# Patient Record
Sex: Female | Born: 1950 | Race: White | Hispanic: No | Marital: Married | Smoking: Current every day smoker
Health system: Southern US, Community
[De-identification: ages and names within clinical notes are randomized; demographics above are authoritative.]

## PROBLEM LIST (undated history)

## (undated) DIAGNOSIS — K219 Gastro-esophageal reflux disease without esophagitis: Secondary | ICD-10-CM

## (undated) DIAGNOSIS — F419 Anxiety disorder, unspecified: Secondary | ICD-10-CM

## (undated) DIAGNOSIS — F32A Depression, unspecified: Secondary | ICD-10-CM

## (undated) DIAGNOSIS — F329 Major depressive disorder, single episode, unspecified: Secondary | ICD-10-CM

## (undated) HISTORY — DX: Depression, unspecified: F32.A

## (undated) HISTORY — DX: Anxiety disorder, unspecified: F41.9

## (undated) HISTORY — DX: Gastro-esophageal reflux disease without esophagitis: K21.9

## (undated) HISTORY — DX: Major depressive disorder, single episode, unspecified: F32.9

---

## 2003-11-21 ENCOUNTER — Other Ambulatory Visit: Payer: Self-pay

## 2003-11-22 ENCOUNTER — Inpatient Hospital Stay: Payer: Self-pay | Admitting: Internal Medicine

## 2003-11-27 ENCOUNTER — Ambulatory Visit: Payer: Self-pay | Admitting: Family Medicine

## 2003-12-12 ENCOUNTER — Ambulatory Visit: Payer: Self-pay | Admitting: Family Medicine

## 2005-11-12 ENCOUNTER — Ambulatory Visit: Payer: Self-pay | Admitting: Internal Medicine

## 2005-11-12 ENCOUNTER — Other Ambulatory Visit: Admission: RE | Admit: 2005-11-12 | Discharge: 2005-11-12 | Payer: Self-pay | Admitting: Internal Medicine

## 2006-10-28 ENCOUNTER — Telehealth: Payer: Self-pay | Admitting: Family Medicine

## 2006-11-08 ENCOUNTER — Ambulatory Visit: Payer: Self-pay | Admitting: Family Medicine

## 2006-11-08 DIAGNOSIS — F419 Anxiety disorder, unspecified: Secondary | ICD-10-CM

## 2007-09-22 ENCOUNTER — Emergency Department (HOSPITAL_COMMUNITY): Admission: EM | Admit: 2007-09-22 | Discharge: 2007-09-22 | Payer: Self-pay | Admitting: Emergency Medicine

## 2008-04-29 ENCOUNTER — Emergency Department: Payer: Self-pay | Admitting: Unknown Physician Specialty

## 2008-04-30 ENCOUNTER — Ambulatory Visit: Payer: Self-pay | Admitting: Family Medicine

## 2008-05-07 ENCOUNTER — Ambulatory Visit: Payer: Self-pay | Admitting: Family Medicine

## 2010-07-04 NOTE — Assessment & Plan Note (Signed)
[facility]                             [place] OFFICE NOTE   Lisa Henderson                        MRN:          [id number]  DATE:11/13/2005                            DOB:          1950-06-24    CHIEF COMPLAINT:  Lisa Henderson returns to our clinic here to re-establish.   HISTORY OF PRESENT ILLNESS:  She has been doing fairly well over the past  few years.  She has not needed to see a physician.  She does come to clinic  today requesting a complete physical exam, including Pap smear, given the  fact that she is overdue and her insurance ends in 2 days.  She does report  some vaginal odor, and would like STD testing.  She was previously on Depo  and had found out that she likely has osteopenia or osteoporosis, and  therefore once she has insurance she would like to receive a DEXA scan.  She  also states that she feels pregnant.  She states that she has been gaining  weight since off the Depo, and would like a pregnancy test today.   REVIEW OF SYSTEMS:  Frequent headaches, no dyspnea, one episode of chest  pain months ago that occurred at rest, a warm sensation went up her neck  bilaterally.  She does report lots of stress and anxiety.  She denies  palpitations, cough, nausea, vomiting, diarrhea or rectal bleeding.   PAST MEDICAL HISTORY:  1. Anxiety/depression.  2. Ulcerative colitis.  3. Peptic ulcer disease.  4. Allergic rhinitis.  5. Osteoporosis.  6. Anemia, likely iron deficiency, resolved.  7. SVT.  8. Hypercholesterolemia.  9. Tobacco abuse.  10.History of abnormal Pap, but has been normal since.   HOSPITALIZATIONS, SURGERIES, PROCEDURES:  1. 1976 barium swallow.  2. Holter monitor showing SVT.  3. 2005 hospitalization for pneumonia.  4. 1980 colposcopy for abnormal Pap.  5. Mammogram, last one in the 1990s.   FAMILY HISTORY:  Father deceased from suicide and depression, mother alive  at age 39 with hypertension,  coronary artery disease and angina,  hypercholesterolemia and pancreatitis, unknown etiology.  She has 3 sisters  who are healthy.  She has a maternal grandmother who had an MI at age 70.  There is no family history of breast cancer, ovarian cancer, uterine cancer  or colon cancer.   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  She has been  divorced since 1997.  She is currently in a relationship and is sexually  active, and is not using condoms.  She gets limited exercise, in only what  she gets at work.  She has 2 children who are healthy.  She eats 1 meal a  day, fruits and vegetables, but lots of carbs.  She avoids fast food.   PHYSICAL EXAMINATION:  VITAL SIGNS:  Height 65.5 inches, weight 143, blood  pressure 122/70, pulse 72, temperature 98.4.  GENERAL:  Healthy-appearing female in no apparent distress.  HEENT:  Extraocular muscles are intact.  Oropharynx is clear.  Nares clear.  Tympanic membranes clear.  No thyromegaly, no lymphadenopathy cervical or  supraclavicular.  CARDIOVASCULAR:  Regular rate and rhythm, no murmurs, rubs or gallops, 2+  peripheral pulses, no peripheral edema.  PULMONARY:  Clear to auscultation bilaterally, no wheezes, rales or rhonchi.  No clubbing, no cyanosis.  ABDOMEN:  Soft, nontender, normoactive bowel sounds, no hepatosplenomegaly.  GENITOURINARY EXAM:  External within normal limits, no lesions seen, no  cervical motion tenderness, no cervical lesions or vaginal wall lesions  seen.  MUSCULOSKELETAL:  Strength 5/5 in upper and lower extremities.  NEUROLOGIC:  Alert and oriented x3.  Cranial nerves II-XII grossly intact.   PROCEDURES:  Wet prep negative.  Urine pregnancy test negative.   ASSESSMENT AND PLAN:  Complete physical exam with Pap:  Pap smear sent along  with gonorrhea and chlamydia tests.  She was also sent for HIV and RPR.  She  is overdue for a  cholesterol screen, and will be sent for this.  Her goal is  less than 130.  We did discuss tobacco abuse, but she is in pre-  contemplative stage.  I recommended mammogram very highly, since she has not  had this done since the 1990s.  I also encouraged her to do a DEXA scan.  She will have to wait until she has insurance to do these.  Because of her  tobacco use, with a 35-pack-year history, I recommended pulmonary function  tests, and we will await her receipt of insurance before performing this for  chronic obstructive pulmonary disease screen.  She will return to see me to  get these scheduled once she gets set up with her new job and receives  insurance.       Lisa Nora, MD      AB/MedQ  DD:  11/13/2005  DT:  11/15/2005  Job #:  [id number]

## 2014-05-10 ENCOUNTER — Ambulatory Visit: Payer: Self-pay

## 2014-10-16 ENCOUNTER — Other Ambulatory Visit: Payer: Self-pay | Admitting: Family Medicine

## 2014-10-21 ENCOUNTER — Other Ambulatory Visit: Payer: Self-pay | Admitting: Family Medicine

## 2014-10-21 DIAGNOSIS — F419 Anxiety disorder, unspecified: Secondary | ICD-10-CM

## 2014-10-23 NOTE — Telephone Encounter (Signed)
Last OV 03/2013

## 2014-10-25 ENCOUNTER — Other Ambulatory Visit: Payer: Self-pay | Admitting: Family Medicine

## 2014-10-25 DIAGNOSIS — F419 Anxiety disorder, unspecified: Secondary | ICD-10-CM

## 2014-10-25 MED ORDER — BUSPIRONE HCL 10 MG PO TABS: 15.0000 mg | ORAL_TABLET | Freq: Three times a day (TID) | ORAL | Status: DC

## 2014-10-25 NOTE — Telephone Encounter (Signed)
Needs her buspar 10 mg. 1 1/2 tablets tid.   She is completely out    [facility] [address]  Call back [phone]    [employer]

## 2014-10-25 NOTE — Telephone Encounter (Signed)
Ok to call in rx for one month.  Thanks.

## 2014-10-26 MED ORDER — BUSPIRONE HCL 10 MG PO TABS: 15.0000 mg | ORAL_TABLET | Freq: Three times a day (TID) | ORAL | Status: DC

## 2014-10-26 NOTE — Telephone Encounter (Signed)
Resent rx as well as called in to [facility]. Allene Dillon, CMA

## 2014-10-26 NOTE — Telephone Encounter (Signed)
Pt called saying that  she called [facility] today and they say they have not recd. A request for this RX.  She is completely out.  can some call her when it has been sent to [place],  Thedore Mins

## 2014-11-06 ENCOUNTER — Ambulatory Visit (INDEPENDENT_AMBULATORY_CARE_PROVIDER_SITE_OTHER): Payer: No Typology Code available for payment source | Admitting: Family Medicine

## 2014-11-06 ENCOUNTER — Encounter: Payer: Self-pay | Admitting: Family Medicine

## 2014-11-06 VITALS — BP 130/82 | HR 92 | Temp 98.3°F | Resp 16 | Ht 66.0 in | Wt 137.0 lb

## 2014-11-06 DIAGNOSIS — F419 Anxiety disorder, unspecified: Secondary | ICD-10-CM

## 2014-11-06 MED ORDER — BUSPIRONE HCL 10 MG PO TABS: 15.0000 mg | ORAL_TABLET | Freq: Three times a day (TID) | ORAL | Status: DC

## 2014-11-06 NOTE — Progress Notes (Signed)
Subjective:    Patient ID: Lisa Henderson, female    DOB: 1950-10-17, 64 y.o.   MRN: [id number]  Anxiety Presents for follow-up visit. Onset was more than 5 years ago. The problem has been unchanged. Symptoms include decreased concentration (ADD), depressed mood, insomnia, irritability (menopause), muscle tension, palpitations and panic (rarely). Patient reports no chest pain, compulsions, confusion, dizziness, dry mouth, excessive worry, feeling of choking, hyperventilation, malaise, nausea, nervous/anxious behavior, obsessions, restlessness, shortness of breath or suicidal ideas. The severity of symptoms is mild. The quality of sleep is fair. Nighttime awakenings: several.   Past treatments include non-benzodiazephine anxiolytics (Buspirone). The treatment provided moderate relief. Compliance with prior treatments has been good.   Does feel that her medication has been helping. Left LC mid-August last year.     Review of Systems  Constitutional: Positive for diaphoresis (hot flashes) and irritability (menopause). Negative for fever, chills, activity change, appetite change, fatigue and unexpected weight change.  HENT: Positive for rhinorrhea.   Respiratory: Negative for shortness of breath.   Cardiovascular: Positive for palpitations. Negative for chest pain.  Gastrointestinal: Negative for nausea.  Neurological: Negative for dizziness.  Psychiatric/Behavioral: Positive for decreased concentration (ADD). Negative for suicidal ideas and confusion. The patient has insomnia. The patient is not nervous/anxious.    BP 130/82 mmHg  Pulse 92  Temp(Src) 98.3 F (36.8 C) (Oral)  Resp 16  Ht  (1.676 m)  Wt 137 lb (62.143 kg)  BMI 22.12 kg/m2   Patient Active Problem List   Diagnosis Date Noted  . Anxiety 11/08/2006   Past Medical History  Diagnosis Date  . GERD (gastroesophageal reflux disease)   . Depression   . Anxiety    Current Outpatient Prescriptions on File Prior to  Visit  Medication Sig  . busPIRone (BUSPAR) 10 MG tablet Take 1.5 tablets (15 mg total) by mouth 3 (three) times daily.   No current facility-administered medications on file prior to visit.   Allergies  Allergen Reactions  . Bicillin C-R     REACTION: unspecified  . Cefaclor     REACTION: unspecified  . Diphenhydramine Hcl     REACTION: unspecified  . Fluoxetine Hcl     REACTION: unspecified  . Prednisone     REACTION: unspecified  . Tetanus Toxoid     REACTION: severe reaction 1980's   History reviewed. No pertinent past surgical history. Social History   Social History  . Marital Status: Married    Spouse Name: N/A  . Number of Children: N/A  . Years of Education: N/A   Occupational History  . Not on file.   Social History Main Topics  . Smoking status: Current Every Day Smoker -- 0.25 packs/day for 30 years  . Smokeless tobacco: Never Used  . Alcohol Use: Yes     Comment: occasionally  . Drug Use: No  . Sexual Activity: Not on file   Other Topics Concern  . Not on file   Social History Narrative   Family History  Problem Relation Age of Onset  . Asthma Mother   . Heart disease Mother   . Hypertension Mother   . Arthritis Mother   . Diabetes Father   . Kidney disease Father   . Arthritis Father   . Asthma Sister   . Allergies Sister   . Asthma Sister   . Allergies Sister   . Asthma Sister   . Allergies Sister       Objective:   Physical Exam  Constitutional: She is oriented to person, place, and time. She appears well-developed and well-nourished.  Neurological: She is alert and oriented to person, place, and time.  Psychiatric: She has a normal mood and affect. Her behavior is normal. Judgment and thought content normal.   BP 130/82 mmHg  Pulse 92  Temp(Src) 98.3 F (36.8 C) (Oral)  Resp 16  Ht  (1.676 m)  Wt 137 lb (62.143 kg)  BMI 22.12 kg/m2      Assessment & Plan:  1. Anxiety Condition is stable  Much better since retired.  Please continue current medication and  plan of care as noted.  Follow up for CPE. Also spent a lot of time talking about old job and sudden loss of co-worker.  Will call if anxiety worsens. Recently got married. Going well.  - busPIRone (BUSPAR) 10 MG tablet; Take 1.5 tablets (15 mg total) by mouth 3 (three) times daily.  Dispense: 90 tablet; Refill: 5  Lorie Phenix, MD

## 2014-11-22 ENCOUNTER — Other Ambulatory Visit: Payer: Self-pay | Admitting: Family Medicine

## 2014-11-22 DIAGNOSIS — F419 Anxiety disorder, unspecified: Secondary | ICD-10-CM

## 2014-11-23 MED ORDER — BUSPIRONE HCL 10 MG PO TABS: ORAL_TABLET | ORAL | Status: DC

## 2014-11-23 NOTE — Telephone Encounter (Signed)
Ok to call in or fax rx.  Thanks.  

## 2014-11-23 NOTE — Addendum Note (Signed)
Addended by: Kavin Leech E on: 11/23/2014 11:40 AM   Modules accepted: Orders

## 2015-05-07 ENCOUNTER — Encounter: Payer: Self-pay | Admitting: Family Medicine

## 2015-05-07 ENCOUNTER — Ambulatory Visit (INDEPENDENT_AMBULATORY_CARE_PROVIDER_SITE_OTHER): Payer: No Typology Code available for payment source | Admitting: Family Medicine

## 2015-05-07 VITALS — BP 120/80 | HR 80 | Temp 98.4°F | Resp 16 | Ht 66.0 in | Wt 139.0 lb

## 2015-05-07 DIAGNOSIS — Z780 Asymptomatic menopausal state: Secondary | ICD-10-CM

## 2015-05-07 DIAGNOSIS — Z0000 Encounter for general adult medical examination without abnormal findings: Secondary | ICD-10-CM | POA: Diagnosis not present

## 2015-05-07 DIAGNOSIS — Z1211 Encounter for screening for malignant neoplasm of colon: Secondary | ICD-10-CM

## 2015-05-07 DIAGNOSIS — Z1239 Encounter for other screening for malignant neoplasm of breast: Secondary | ICD-10-CM | POA: Diagnosis not present

## 2015-05-07 LAB — POCT URINALYSIS DIPSTICK
Bilirubin, UA: NEGATIVE
GLUCOSE UA: NEGATIVE
Ketones, UA: NEGATIVE
Leukocytes, UA: NEGATIVE
NITRITE UA: NEGATIVE
PROTEIN UA: NEGATIVE
RBC UA: NEGATIVE
Spec Grav, UA: 1.01
UROBILINOGEN UA: 0.2
pH, UA: 5

## 2015-05-07 NOTE — Progress Notes (Signed)
Patient ID: Lisa Henderson, female   DOB: 1950-09-03, 65 y.o.   MRN: [id number]       Patient: Lisa PattenDiana B Henderson, Female    DOB: 1950-09-03, 65 y.o.   MRN: [id number] Visit Date: 05/07/2015  Today's Provider: Lorie PhenixNancy Ada Woodbury, MD   Chief Complaint  Patient presents with  . Annual Exam   Subjective:    Annual physical exam Lisa Henderson is a 65 y.o. female who presents today for health maintenance and complete physical. She feels well. She reports exercising active with daily activites. She reports she is sleeping fairly well, 6-8 hours.  -----------------------------------------------------------------  Review of Systems  Constitutional: Negative.   HENT: Positive for tinnitus.   Eyes: Negative.   Respiratory: Negative.   Cardiovascular: Negative.   Gastrointestinal: Negative.   Endocrine: Negative.   Genitourinary: Negative.   Musculoskeletal: Positive for back pain and arthralgias.  Skin: Negative.   Allergic/Immunologic: Positive for environmental allergies.  Neurological: Negative.   Hematological: Negative.   Psychiatric/Behavioral: Negative.     Social History      She  reports that she has been smoking.  She has never used smokeless tobacco. She reports that she drinks alcohol. She reports that she does not use illicit drugs.       Social History   Social History  . Marital Status: Married    Spouse Name: N/A  . Number of Children: N/A  . Years of Education: N/A   Social History Main Topics  . Smoking status: Current Every Day Smoker -- 0.25 packs/day for 30 years  . Smokeless tobacco: Never Used  . Alcohol Use: Yes     Comment: occasionally  . Drug Use: No  . Sexual Activity: Not Asked   Other Topics Concern  . None   Social History Narrative    Past Medical History  Diagnosis Date  . GERD (gastroesophageal reflux disease)   . Depression   . Anxiety      Patient Active Problem List   Diagnosis Date Noted  . Anxiety 11/08/2006    History  reviewed. No pertinent past surgical history.  Family History        Family Status  Relation Status Death Age  . Mother Alive   . Father Deceased 6850    suicide  . Sister Alive   . Sister Alive   . Sister Alive         Her family history includes Allergies in her sister, sister, and sister; Arthritis in her father and mother; Asthma in her mother, sister, sister, and sister; Diabetes in her father; Heart disease in her mother; Hypertension in her mother; Kidney disease in her father; Osteoporosis in her mother.    Allergies  Allergen Reactions  . Bicillin C-R     REACTION: unspecified  . Cefaclor     REACTION: unspecified  . Diphenhydramine Hcl     REACTION: unspecified  . Fluoxetine Hcl     REACTION: unspecified  . Prednisone     REACTION: unspecified  . Tetanus Toxoid     REACTION: severe reaction 1980's    Previous Medications   BUSPIRONE (BUSPAR) 10 MG TABLET    TAKE ONE & ONE-HALF TABLETS BY MOUTH THREE TIMES DAILY   CALCIUM CARBONATE-VIT D-MIN (CALCIUM 600+D PLUS MINERALS) 600-400 MG-UNIT CHEW    Chew by mouth.   LORATADINE (CLARITIN) 10 MG TABLET    Take 10 mg by mouth daily.   MULTIPLE VITAMINS-MINERALS (MULTIVITAMIN ADULT PO)    Take by  mouth.   RANITIDINE (ZANTAC) 150 MG TABLET    Take 150 mg by mouth 2 (two) times daily.    Patient Care Team: Lorie Phenix, MD as PCP - General (Family Medicine)     Objective:   Vitals: BP 120/80 mmHg  Pulse 80  Temp(Src) 98.4 F (36.9 C) (Oral)  Resp 16  Ht  (1.676 m)  Wt 139 lb (63.05 kg)  BMI 22.45 kg/m2   Physical Exam  Constitutional: She is oriented to person, place, and time. She appears well-developed and well-nourished.  HENT:  Head: Normocephalic and atraumatic.  Right Ear: Tympanic membrane, external ear and ear canal normal.  Left Ear: Tympanic membrane, external ear and ear canal normal.  Nose: Nose normal.  Mouth/Throat: Uvula is midline, oropharynx is clear and moist and mucous membranes are  normal.  Eyes: Conjunctivae, EOM and lids are normal. Pupils are equal, round, and reactive to light.  Neck: Trachea normal and normal range of motion. Neck supple. Carotid bruit is not present. No thyroid mass and no thyromegaly present.  Cardiovascular: Normal rate, regular rhythm and normal heart sounds.   Pulmonary/Chest: Effort normal and breath sounds normal.  Abdominal: Soft. Normal appearance and bowel sounds are normal. There is no hepatosplenomegaly. There is no tenderness.  Genitourinary: No breast swelling, tenderness or discharge.  Musculoskeletal: Normal range of motion.  Lymphadenopathy:    She has no cervical adenopathy.    She has no axillary adenopathy.  Neurological: She is alert and oriented to person, place, and time. She has normal strength. No cranial nerve deficit.  Skin: Skin is warm, dry and intact.  Psychiatric: She has a normal mood and affect. Her speech is normal and behavior is normal. Judgment and thought content normal. Cognition and memory are normal.     Depression Screen PHQ 2/9 Scores 05/07/2015  PHQ - 2 Score 0      Assessment & Plan:     Routine Health Maintenance and Physical Exam  Exercise Activities and Dietary recommendations Goals    None       There is no immunization history on file for this patient.   1. Annual physical exam Stable. Continue to eat healthy and exercise.   - POCT urinalysis dipstick  2. Postmenopausal Will check bone density .  Does smoke some.   - DG Bone Density; Future  3. Breast cancer screening Will schedule.   - MM DIGITAL SCREENING BILATERAL; Future  4. Colon cancer screening Does not want colonoscopy. Will scheduled Cologuard.   - Cologuard   Patient was seen and examined by Leo Grosser, MD, and note scribed by Rondel Baton, CMA.   I have reviewed the document for accuracy and completeness and I agree with above. Leo Grosser, MD   Lorie Phenix, MD        --------------------------------------------------------------------

## 2015-05-08 LAB — COLOGUARD

## 2015-05-27 LAB — HEPATIC FUNCTION PANEL
ALT: 17 U/L (ref 7–35)
AST: 22 U/L (ref 13–35)
Alkaline Phosphatase: 80 U/L (ref 25–125)
Bilirubin, Total: 0.4 mg/dL

## 2015-05-27 LAB — CBC AND DIFFERENTIAL
HCT: 41 % (ref 36–46)
HEMOGLOBIN: 13.6 g/dL (ref 12.0–16.0)
Platelets: 382 10*3/uL (ref 150–399)
WBC: 8.6 10^3/mL

## 2015-05-27 LAB — BASIC METABOLIC PANEL
BUN: 9 mg/dL (ref 4–21)
Creatinine: 0.7 mg/dL (ref 0.5–1.1)
GLUCOSE: 94 mg/dL
POTASSIUM: 4.1 mmol/L (ref 3.4–5.3)
SODIUM: 137 mmol/L (ref 137–147)

## 2015-05-27 LAB — LIPID PANEL
CHOLESTEROL: 205 mg/dL — AB (ref 0–200)
HDL: 90 mg/dL — AB (ref 35–70)
LDL Cholesterol: 99 mg/dL
LDl/HDL Ratio: 2.3
Triglycerides: 82 mg/dL (ref 40–160)

## 2015-05-27 LAB — TSH: TSH: 0.84 u[IU]/mL (ref 0.41–5.90)

## 2015-05-29 ENCOUNTER — Telehealth: Payer: Self-pay

## 2015-05-29 NOTE — Telephone Encounter (Signed)
Pt is returning call.  [phone]/[id number]#[phone]/MW

## 2015-05-29 NOTE — Telephone Encounter (Signed)
Pt advised labs stable-aa

## 2015-05-29 NOTE — Telephone Encounter (Signed)
lmtcb-lab results are on desk [id number]-aa

## 2015-05-31 ENCOUNTER — Encounter: Payer: Self-pay | Admitting: Family Medicine

## 2015-06-04 ENCOUNTER — Encounter: Payer: Self-pay | Admitting: Family Medicine

## 2015-06-19 ENCOUNTER — Encounter: Payer: Self-pay | Admitting: Family Medicine

## 2015-09-10 ENCOUNTER — Encounter: Payer: Self-pay | Admitting: Family Medicine

## 2015-11-05 ENCOUNTER — Encounter: Payer: Self-pay | Admitting: Family Medicine

## 2015-11-05 ENCOUNTER — Ambulatory Visit (INDEPENDENT_AMBULATORY_CARE_PROVIDER_SITE_OTHER): Payer: No Typology Code available for payment source | Admitting: Family Medicine

## 2015-11-05 VITALS — BP 128/72 | HR 106 | Temp 98.5°F | Resp 16 | Ht 66.0 in | Wt 140.0 lb

## 2015-11-05 DIAGNOSIS — F419 Anxiety disorder, unspecified: Secondary | ICD-10-CM | POA: Diagnosis not present

## 2015-11-05 DIAGNOSIS — Z8719 Personal history of other diseases of the digestive system: Secondary | ICD-10-CM | POA: Diagnosis not present

## 2015-11-05 MED ORDER — BUSPIRONE HCL 10 MG PO TABS: ORAL_TABLET | ORAL | 11 refills | Status: DC

## 2015-11-05 NOTE — Progress Notes (Signed)
Patient: Lisa Henderson Female    DOB: 09/26/1950   65 y.o.   MRN: [id number] Visit Date: 11/05/2015  Today's Provider: Mila Merryonald Vitaly Wanat, MD   Chief Complaint  Patient presents with  . Anxiety    6 month follow up.   Subjective:    HPI This is a previous patient of Dr. Elease HashimotoMaloney present today as new patient to me to establish care and follow up on chronic medical problems.   Patient is here to follow up on anxiety. Patient reports that she feels that her medication may need to be adjusted. Patient reports that she has been under a lot of stress lately. Patient was last seen in the office about 6 months ago with Dr. Elease HashimotoMaloney.  No changes were made at that time.      Allergies  Allergen Reactions  . Bicillin C-R     REACTION: unspecified  . Cefaclor     REACTION: unspecified  . Diphenhydramine Hcl     REACTION: unspecified  . Fluoxetine Hcl     REACTION: unspecified  . Prednisone     REACTION: unspecified  . Tetanus Toxoid     REACTION: severe reaction 1980's     Current Outpatient Prescriptions:  .  busPIRone (BUSPAR) 10 MG tablet, TAKE ONE & ONE-HALF TABLETS BY MOUTH THREE TIMES DAILY, Disp: 135 tablet, Rfl: 5 .  Calcium Carbonate-Vit D-Min (CALCIUM 600+D PLUS MINERALS) 600-400 MG-UNIT CHEW, Chew by mouth., Disp: , Rfl:  .  loratadine (CLARITIN) 10 MG tablet, Take 10 mg by mouth daily., Disp: , Rfl:  .  Multiple Vitamins-Minerals (MULTIVITAMIN ADULT PO), Take by mouth., Disp: , Rfl:  .  ranitidine (ZANTAC) 150 MG tablet, Take 150 mg by mouth 2 (two) times daily., Disp: , Rfl:   Review of Systems  Constitutional: Negative.   Respiratory: Negative.   Cardiovascular: Negative.   Neurological: Negative.   Psychiatric/Behavioral: Positive for agitation and decreased concentration. Negative for behavioral problems, confusion, dysphoric mood, hallucinations, self-injury, sleep disturbance and suicidal ideas. The patient is nervous/anxious.     Social History  Substance  Use Topics  . Smoking status: Current Every Day Smoker    Packs/day: 0.25    Years: 30.00  . Smokeless tobacco: Never Used  . Alcohol use Yes     Comment: occasionally   Objective:   BP 128/72 (BP Location: Left Arm, Patient Position: Sitting, Cuff Size: Normal)   Pulse (!) 106   Temp 98.5 F (36.9 C)   Resp 16   Ht 5\' 6"  (1.676 m)   Wt 140 lb (63.5 kg)   BMI 22.60 kg/m   Physical Exam  General appearance: alert, well developed, well nourished, cooperative and in no distress Head: Normocephalic, without obvious abnormality, atraumatic Respiratory: Respirations even and unlabored, normal respiratory rate Extremities: No gross deformities Skin: Skin color, texture, turgor normal. No rashes seen  Psych: Appropriate mood and affect. Neurologic: Mental status: Alert, oriented to person, place, and time, thought content appropriate.     Assessment & Plan:     1. Anxiety Fairly well controlled, no panic attacks on current regiment. Discussed increasing dose, but she feels she is managing current stress fairly well. She can call back if she decides she wants to increase to increase to 2mg  TID.  - busPIRone (BUSPAR) 10 MG tablet; TAKE ONE & ONE-HALF TABLETS BY MOUTH THREE TIMES DAILY  Dispense: 135 tablet; Refill: 11  2. History of duodenal ulcer Currently asymptomatic.   3.  History of colitis She states this has not flared in many years and is no longer followed by GI.        Mila Merry, MD  [facility]

## 2016-10-09 IMAGING — CR DG LUMBAR SPINE 2-3V
1 series · 3 of 3 positions shown · non-contrast
Comparison: None.

CLINICAL DATA: Back pain, fell at work 5 years ago

EXAM:
LUMBAR SPINE - 2-3 VIEW

[Series 1: dxr lumbar spine ap and lateral · 0.14mm/px · 3 of 3 slices shown]
[im 1/3]
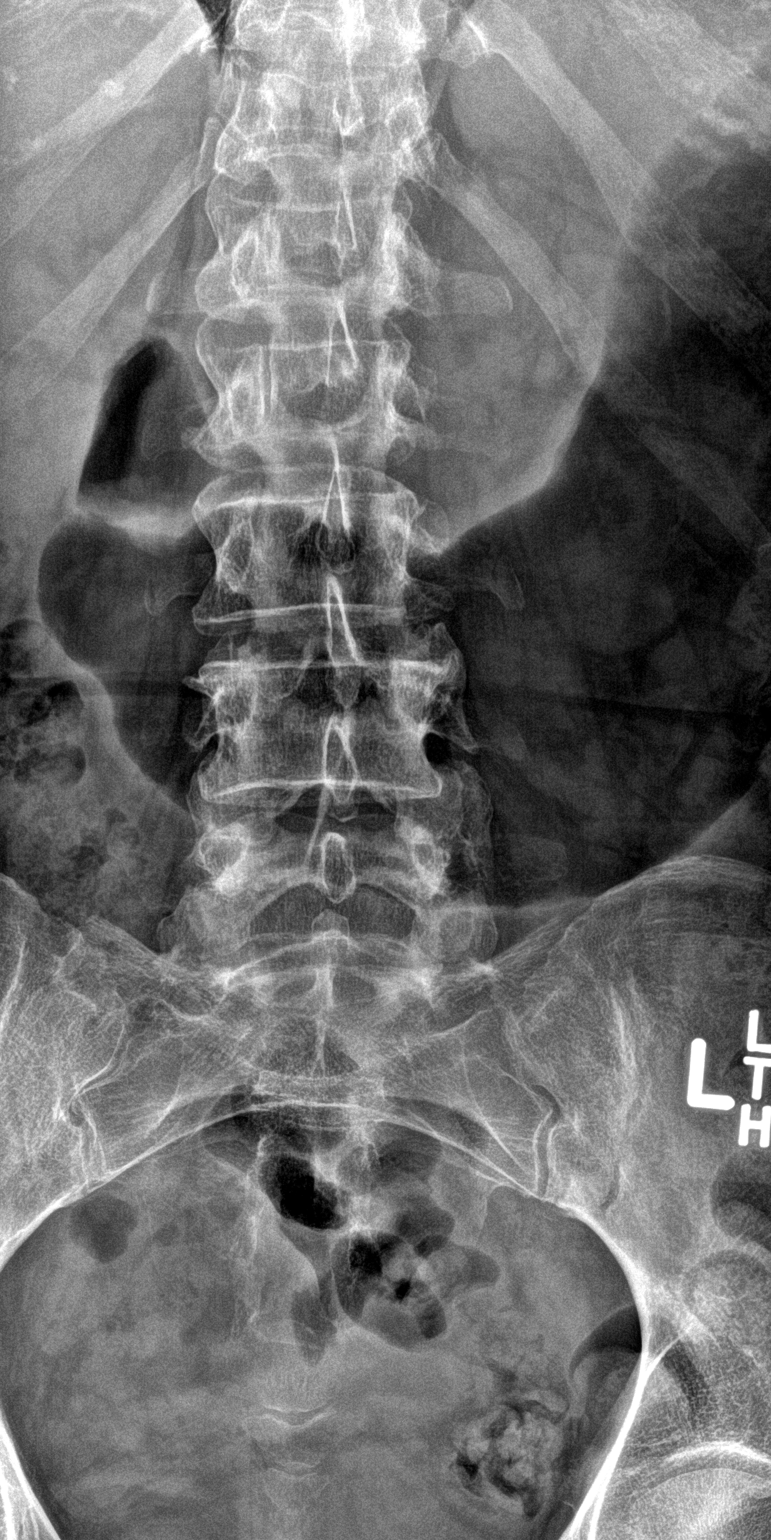
[im 2/3]
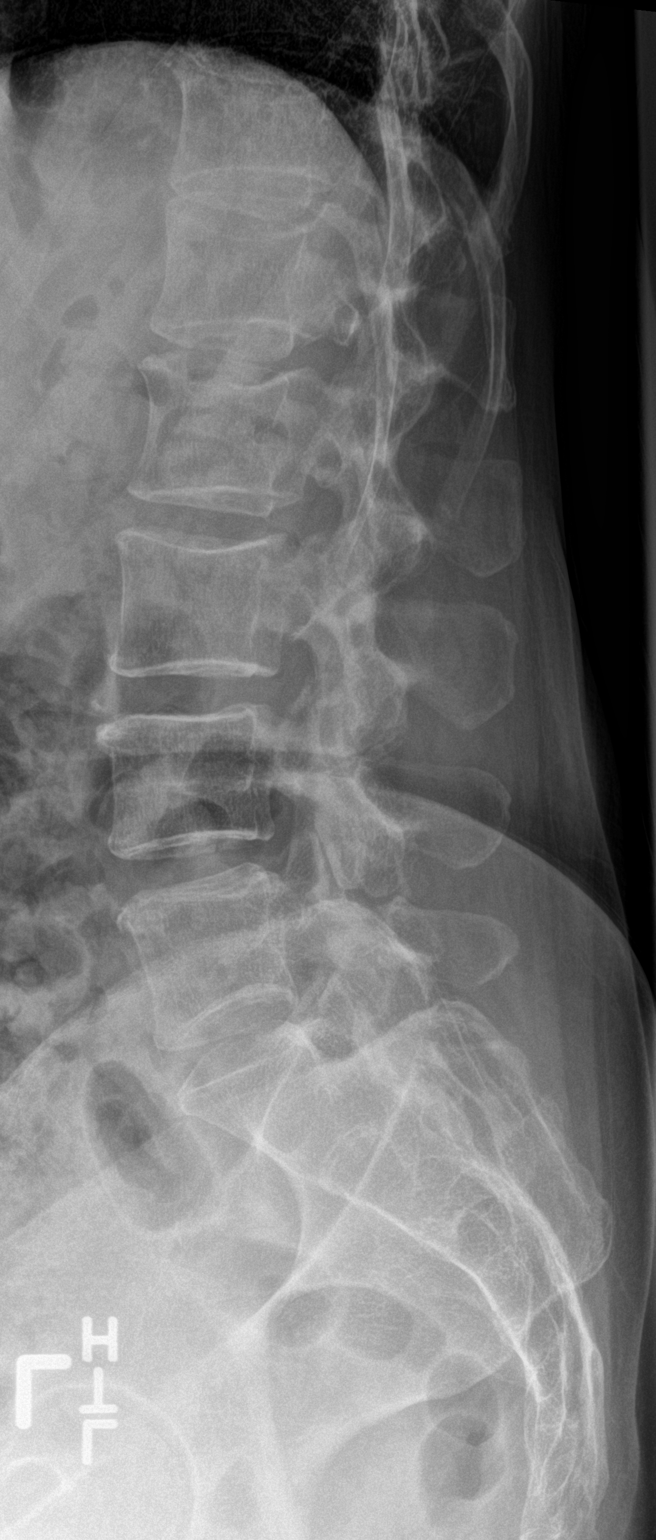
[im 3/3]
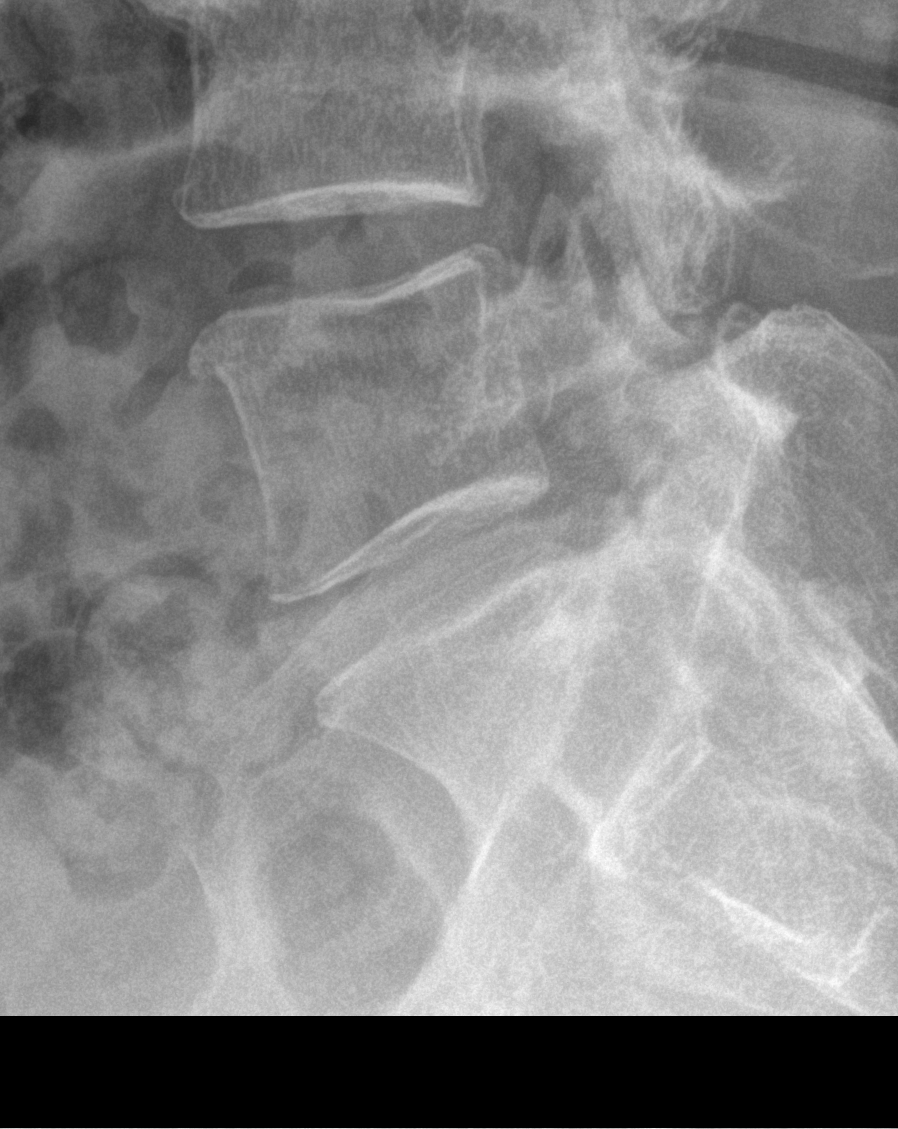

[3 of 3 positions shown; findings below may reference images not displayed]

FINDINGS: The lumbar vertebrae are in normal alignment. No acute compression
deformity is seen. There is slight irregularity to the superior
endplate of L2 which could be old but no acute abnormality is
evident. Intervertebral disc spaces appear normal. The SI joints are
corticated.
IMPRESSION: Normal alignment.  No acute abnormality.

## 2016-10-26 ENCOUNTER — Ambulatory Visit: Payer: No Typology Code available for payment source | Admitting: Family Medicine

## 2016-10-27 ENCOUNTER — Encounter: Payer: Self-pay | Admitting: Family Medicine

## 2016-10-27 ENCOUNTER — Ambulatory Visit (INDEPENDENT_AMBULATORY_CARE_PROVIDER_SITE_OTHER): Payer: No Typology Code available for payment source | Admitting: Family Medicine

## 2016-10-27 DIAGNOSIS — M5030 Other cervical disc degeneration, unspecified cervical region: Secondary | ICD-10-CM

## 2016-10-27 DIAGNOSIS — F419 Anxiety disorder, unspecified: Secondary | ICD-10-CM | POA: Diagnosis not present

## 2016-10-27 MED ORDER — BUSPIRONE HCL 10 MG PO TABS: ORAL_TABLET | ORAL | 11 refills | Status: DC

## 2016-10-27 NOTE — Progress Notes (Signed)
       Patient: Lisa Henderson Female    DOB: 1950/03/30   66 y.o.   MRN: [id number] Visit Date: 10/27/2016  Today's Provider: Mila Merryonald Fisher, MD   Chief Complaint  Patient presents with  . Anxiety   Subjective:    HPI Follow up of Anxiety:  Patient was last seen for this problem 1 year ago and no changes were made. Patient reports good compliance with treatment, good tolerance and good symptom control.   She states she needs handicap permit form completed due to history of complete disability related to cervical disk disease      Allergies  Allergen Reactions  . Bicillin C-R     REACTION: unspecified  . Cefaclor     REACTION: unspecified  . Diphenhydramine Hcl     REACTION: unspecified  . Fluoxetine Hcl     REACTION: unspecified  . Prednisone     REACTION: unspecified  . Tetanus Toxoid     REACTION: severe reaction 1980's     Current Outpatient Prescriptions:  .  busPIRone (BUSPAR) 10 MG tablet, TAKE ONE & ONE-HALF TABLETS BY MOUTH THREE TIMES DAILY, Disp: 135 tablet, Rfl: 11 .  Calcium Carbonate-Vit D-Min (CALCIUM 600+D PLUS MINERALS) 600-400 MG-UNIT CHEW, Chew by mouth., Disp: , Rfl:  .  loratadine (CLARITIN) 10 MG tablet, Take 10 mg by mouth daily., Disp: , Rfl:  .  Multiple Vitamins-Minerals (MULTIVITAMIN ADULT PO), Take by mouth., Disp: , Rfl:  .  ranitidine (ZANTAC) 150 MG tablet, Take 150 mg by mouth 2 (two) times daily., Disp: , Rfl:   Review of Systems  Constitutional: Negative for appetite change, chills, fatigue and fever.  Respiratory: Negative for chest tightness and shortness of breath.   Cardiovascular: Negative for chest pain and palpitations.  Gastrointestinal: Negative for abdominal pain, nausea and vomiting.  Neurological: Negative for dizziness and weakness.    Social History  Substance Use Topics  . Smoking status: Current Every Day Smoker    Packs/day: 0.25    Years: 30.00  . Smokeless tobacco: Never Used  . Alcohol use Yes   Comment: occasionally   Objective:   BP 100/60 (BP Location: Left Arm, Patient Position: Sitting, Cuff Size: Normal)   Pulse 80   Temp 98.4 F (36.9 C) (Oral)   Resp 16   Wt 146 lb (66.2 kg)   SpO2 98% Comment: room air  BMI 23.57 kg/m  There were no vitals filed for this visit.   Physical Exam   General Appearance:    Alert, cooperative, no distress  Eyes:    PERRL, conjunctiva/corneas clear, EOM's intact       Lungs:     Clear to auscultation bilaterally, respirations unlabored  Heart:    Regular rate and rhythm  Neurologic:   Awake, alert, oriented x 3. No apparent focal neurological           defect.           Assessment & Plan:     1. Anxiety Doing well, Continue current medications.   - busPIRone (BUSPAR) 10 MG tablet; TAKE ONE & ONE-HALF TABLETS BY MOUTH THREE TIMES DAILY  Dispense: 135 tablet; Refill: 11  2. Degenerative disc disease, cervical 'completed forms for handicap parking permit.        Mila Merryonald Fisher, MD  [facility]

## 2017-10-27 ENCOUNTER — Ambulatory Visit: Payer: No Typology Code available for payment source | Admitting: Family Medicine

## 2017-10-27 ENCOUNTER — Encounter: Payer: Self-pay | Admitting: Family Medicine

## 2017-10-27 VITALS — BP 120/80 | HR 80 | Temp 98.4°F | Wt 144.2 lb

## 2017-10-27 DIAGNOSIS — Z5320 Procedure and treatment not carried out because of patient's decision for unspecified reasons: Secondary | ICD-10-CM | POA: Diagnosis not present

## 2017-10-27 DIAGNOSIS — F419 Anxiety disorder, unspecified: Secondary | ICD-10-CM | POA: Diagnosis not present

## 2017-10-27 DIAGNOSIS — N6453 Retraction of nipple: Secondary | ICD-10-CM | POA: Diagnosis not present

## 2017-10-27 MED ORDER — BUSPIRONE HCL 10 MG PO TABS: ORAL_TABLET | ORAL | 11 refills | Status: DC

## 2017-10-27 NOTE — Progress Notes (Addendum)
Patient: Lisa Henderson Female    DOB: Jun 19, 1950   67 y.o.   MRN: [id number] Visit Date: 10/27/2017  Today's Provider: Shirlee Latch, MD   Chief Complaint  Patient presents with  . Anxiety   Subjective:    I, Presley Raddle, CMA, am acting as a scribe for Shirlee Latch, MD.   HPI Anxiety:  Patient presents for a follow up. Last OV was on 10/27/2016. Patient advised to continue Buspar 10 mg 1.5 tablets TID. She reports good compliance with treatment plan. Symptoms are stable.  She states that she has tried multiple medications for anxiety in the past and had significant side effects.  BuSpar is the only medication that has given her good relief of her symptoms as well as lack of side effects.  Patient is also concerned about nipple changes.  Bilateral nipples have shown dimpling that has occurred over the last few weeks to months.  She denies any nipple discharge or skin changes.  Her last mammogram was in 06/2015 that showed bilateral breast asymmetry.  BI-RADS 3.  Recommended diagnostic bilateral mammogram in 6 months.  Patient has not followed up on this since that time.  Discussed healthcare maintenance with patient.  She does not want a flu shot because she is concerned that she will get the flu from the flu shot.  She is also not interested in pneumonia vaccination at this time.  She states that her last Pap smear was in 2007 and she does not believe that she needs to get any more as she is healthy without any problems.  She has never had colon cancer screening.  She believes that she is healthy and therefore does not need to have any "medical interventions."   Depression screen [facility] 2/9 10/27/2017 10/27/2016 05/07/2015  Decreased Interest 0 0 0  Down, Depressed, Hopeless 0 0 0  PHQ - 2 Score 0 0 0  Altered sleeping 1 1 -  Tired, decreased energy 0 0 -  Change in appetite 0 0 -  Feeling bad or failure about yourself  0 0 -  Trouble concentrating 0 0 -  Moving slowly or  fidgety/restless 0 0 -  Suicidal thoughts 0 0 -  PHQ-9 Score 1 1 -  Difficult doing work/chores Not difficult at all Not difficult at all -    GAD 7 : Generalized Anxiety Score 10/27/2017  Nervous, Anxious, on Edge 1  Control/stop worrying 0  Worry too much - different things 1  Trouble relaxing 0  Restless 0  Easily annoyed or irritable 1  Afraid - awful might happen 0  Total GAD 7 Score 3  Anxiety Difficulty Not difficult at all        Allergies  Allergen Reactions  . Bicillin C-R     REACTION: unspecified  . Cefaclor     REACTION: unspecified  . Diphenhydramine Hcl     REACTION: unspecified  . Fluoxetine Hcl     REACTION: unspecified  . Prednisone     REACTION: unspecified  . Tetanus Toxoid     REACTION: severe reaction 1980's     Current Outpatient Medications:  .  busPIRone (BUSPAR) 10 MG tablet, TAKE ONE & ONE-HALF TABLETS BY MOUTH THREE TIMES DAILY, Disp: 135 tablet, Rfl: 11 .  Calcium Carbonate-Vit D-Min (CALCIUM 600+D PLUS MINERALS) 600-400 MG-UNIT CHEW, Chew by mouth., Disp: , Rfl:  .  Cholecalciferol (VITAMIN D3) 2000 units CHEW, Chew by mouth., Disp: , Rfl:  .  loratadine (  CLARITIN) 10 MG tablet, Take 10 mg by mouth daily., Disp: , Rfl:  .  Multiple Vitamins-Minerals (MULTIVITAMIN ADULT PO), Take by mouth., Disp: , Rfl:  .  ranitidine (ZANTAC) 150 MG tablet, Take 150 mg by mouth 2 (two) times daily., Disp: , Rfl:   Review of Systems  Constitutional: Negative.   Respiratory: Negative.   Cardiovascular: Negative.   Musculoskeletal: Negative.   Psychiatric/Behavioral: The patient is nervous/anxious.     Social History   Tobacco Use  . Smoking status: Current Every Day Smoker    Packs/day: 0.25    Years: 30.00    Pack years: 7.50  . Smokeless tobacco: Never Used  . Tobacco comment: smokes 6 cigarettes daily  Substance Use Topics  . Alcohol use: Yes    Comment: occasionally   Objective:   BP 120/80 (BP Location: Left Arm, Patient Position:  Sitting, Cuff Size: Normal)   Pulse 80   Temp 98.4 F (36.9 C) (Oral)   Wt 144 lb 3.2 oz (65.4 kg)   SpO2 98%   BMI 23.27 kg/m  Vitals:   10/27/17 1318  BP: 120/80  Pulse: 80  Temp: 98.4 F (36.9 C)  TempSrc: Oral  SpO2: 98%  Weight: 144 lb 3.2 oz (65.4 kg)     Physical Exam  Constitutional: She is oriented to person, place, and time. She appears well-developed and well-nourished. No distress.  Cardiovascular: Normal rate, regular rhythm, normal heart sounds and intact distal pulses.  No murmur heard. Pulmonary/Chest: Effort normal and breath sounds normal. No respiratory distress. She has no wheezes. She has no rales.  Abdominal: Soft. She exhibits no distension. There is no tenderness.  Genitourinary:  Genitourinary Comments: Breasts: breasts with no suspicious masses, no skin changes or axillary nodes.  Bilateral nipples show dimpling and retraction.  Musculoskeletal: She exhibits no edema.  Neurological: She is alert and oriented to person, place, and time.  Skin: Skin is warm and dry. Capillary refill takes less than 2 seconds. No rash noted.  Psychiatric: She has a normal mood and affect. Her behavior is normal.  Vitals reviewed.       Assessment & Plan:   1. Anxiety -Stable and well-controlled -Continue BuSpar at current dose of 15 mg 3 times daily - Follow-up in 1 year - busPIRone (BUSPAR) 10 MG tablet; TAKE ONE & ONE-HALF TABLETS BY MOUTH THREE TIMES DAILY  Dispense: 135 tablet; Refill: 11  2. Nipple retraction -New problem - Notable nipple retraction and dimpling on exam -No nipple discharge or skin changes - Changes are symmetric in both breasts - Discussed importance of getting diagnostic mammogram with the patient and she reluctantly agrees - MM DIAG BREAST TOMO BILATERAL; Future  3. Colon cancer screening declined 4. Cervical cancer screening declined -As above, discussed importance of screening and health maintenance with the patient - Discussed  the difference between diagnostic care and screening -Discussed the importance of healthy people without symptoms being screened for cancers with well validated tests at appropriate intervals -Encouraged patient to consider colon cancer and cervical cancer screening as well as vaccinations -Patient will call to schedule physical so we can discuss this further    Meds ordered this encounter  Medications  . busPIRone (BUSPAR) 10 MG tablet    Sig: TAKE ONE & ONE-HALF TABLETS BY MOUTH THREE TIMES DAILY    Dispense:  135 tablet    Refill:  11     Return in about 4 weeks (around 11/24/2017), or if symptoms worsen or fail  to improve, for physical.   The entirety of the information documented in the History of Present Illness, Review of Systems and Physical Exam were personally obtained by me. Portions of this information were initially documented by Presley Raddle, CMA and reviewed by me for thoroughness and accuracy.    Erasmo Downer, MD, MPH [facility] 10/27/2017 2:07 PM

## 2017-10-27 NOTE — Patient Instructions (Signed)
[facility] Address: [address], [place], [place] [address] Phone: [phone]

## 2017-11-05 ENCOUNTER — Telehealth: Payer: Self-pay | Admitting: Family Medicine

## 2017-11-05 NOTE — Telephone Encounter (Signed)
[facility].Their office will contact pt.Pt was also given their contact information

## 2017-11-08 ENCOUNTER — Other Ambulatory Visit: Payer: Self-pay | Admitting: Family Medicine

## 2017-11-08 ENCOUNTER — Telehealth: Payer: Self-pay | Admitting: Family Medicine

## 2017-11-08 DIAGNOSIS — N6453 Retraction of nipple: Secondary | ICD-10-CM

## 2017-11-08 NOTE — Telephone Encounter (Signed)
Pt [facility] office was advised and will contact pt after [facility]

## 2017-11-16 ENCOUNTER — Other Ambulatory Visit: Payer: Self-pay

## 2017-11-16 ENCOUNTER — Ambulatory Visit: Payer: No Typology Code available for payment source | Admitting: Family Medicine

## 2017-11-22 LAB — HM MAMMOGRAPHY

## 2017-12-09 ENCOUNTER — Telehealth: Payer: Self-pay | Admitting: Family Medicine

## 2017-12-09 NOTE — Telephone Encounter (Signed)
Noted  Lisa Henderson, Lisa Schlein, MD, MPH [facility] 12/09/2017 10:13 AM

## 2017-12-09 NOTE — Telephone Encounter (Signed)
Pt cancelled appointment for diagnostic mammogram due to nipple dimpling

## 2018-10-11 ENCOUNTER — Telehealth: Payer: Self-pay

## 2018-10-11 NOTE — Telephone Encounter (Signed)
Patient called requesting to be switched back to seen Dr. Caryn Section from Dr. B due to not wanting to come in the office for appointment to get medication refill on her Buspar. Patient states that she does not want to have a physical or office visit and Dr. Caryn Section normally sends in her medication to the pharmacy without making her schedule  appointment to be seen. Patient is requesting a phone call to let her know if she could switch back to seen Dr. Caryn Section instead of Dr. B. L.O.V. was 10/27/2017 & last CPE was 05/07/2015, please advise.

## 2018-10-12 NOTE — Telephone Encounter (Signed)
I would imagine that most MDs/APPs would want to see the patient at least annually for any chronic conditions or CPEs.  This maintains her as a patient and then we can continue filling medications.  It is up to Dr Caryn Section whether he would like to resume her care.

## 2018-10-21 ENCOUNTER — Ambulatory Visit: Payer: No Typology Code available for payment source | Admitting: Family Medicine

## 2018-10-21 ENCOUNTER — Other Ambulatory Visit: Payer: Self-pay

## 2018-10-21 ENCOUNTER — Encounter: Payer: Self-pay | Admitting: Family Medicine

## 2018-10-21 VITALS — BP 120/76 | HR 84 | Temp 98.4°F | Resp 16 | Ht 66.0 in | Wt 140.0 lb

## 2018-10-21 DIAGNOSIS — E2839 Other primary ovarian failure: Secondary | ICD-10-CM

## 2018-10-21 DIAGNOSIS — F419 Anxiety disorder, unspecified: Secondary | ICD-10-CM | POA: Diagnosis not present

## 2018-10-21 DIAGNOSIS — Z136 Encounter for screening for cardiovascular disorders: Secondary | ICD-10-CM | POA: Diagnosis not present

## 2018-10-21 MED ORDER — BUSPIRONE HCL 10 MG PO TABS: ORAL_TABLET | ORAL | 11 refills | Status: DC

## 2018-10-21 NOTE — Patient Instructions (Addendum)
.   Please review the attached list of medications and notify my office if there are any errors.   . Please bring all of your medications to every appointment so we can make sure that our medication list is the same as yours.   . It is especially important to get the annual flu vaccine this year. If you haven't had it already, please go to your pharmacy or call the office as soon as possible to schedule you flu shot.   Bring by a copy of the stool test for colon cancer screening for your medical record

## 2018-10-21 NOTE — Progress Notes (Signed)
Patient: Lisa Henderson Female    DOB: 1950/06/01   68 y.o.   MRN: [id number] Visit Date: 10/21/2018  Today's Provider: Lelon Huh, MD   No chief complaint on file.  Subjective:   HPI Patient comes in today for a follow up. She was last seen in the office 1 year ago. She is requesting refills on Buspar 10mg  1 1/2 tablet daily. She reports good compliance, and good symptom control.  States medication works very well. She is working on current back on smoking.  BP Readings from Last 3 Encounters:  10/21/18 120/76  10/27/17 120/80  10/27/16 100/60   Wt Readings from Last 3 Encounters:  10/21/18 140 lb (63.5 kg)  10/27/17 144 lb 3.2 oz (65.4 kg)  10/27/16 146 lb (66.2 kg)   GAD 7 : Generalized Anxiety Score 10/27/2017  Nervous, Anxious, on Edge 1  Control/stop worrying 0  Worry too much - different things 1  Trouble relaxing 0  Restless 0  Easily annoyed or irritable 1  Afraid - awful might happen 0  Total GAD 7 Score 3  Anxiety Difficulty Not difficult at all     Allergies  Allergen Reactions  . Bicillin C-R     REACTION: unspecified  . Cefaclor     REACTION: unspecified  . Diphenhydramine Hcl     REACTION: unspecified  . Fluoxetine Hcl     REACTION: unspecified  . Prednisone     REACTION: unspecified  . Tetanus Toxoid     REACTION: severe reaction 1980's     Current Outpatient Medications:  .  busPIRone (BUSPAR) 10 MG tablet, TAKE ONE & ONE-HALF TABLETS BY MOUTH THREE TIMES DAILY, Disp: 135 tablet, Rfl: 11 .  Calcium Carbonate-Vit D-Min (CALCIUM 600+D PLUS MINERALS) 600-400 MG-UNIT CHEW, Chew by mouth., Disp: , Rfl:  .  Cholecalciferol (VITAMIN D3) 2000 units CHEW, Chew by mouth., Disp: , Rfl:  .  loratadine (CLARITIN) 10 MG tablet, Take 10 mg by mouth daily., Disp: , Rfl:  .  Multiple Vitamins-Minerals (MULTIVITAMIN ADULT PO), Take by mouth., Disp: , Rfl:  .  ranitidine (ZANTAC) 150 MG tablet, Take 150 mg by mouth 2 (two) times daily., Disp: , Rfl:   Review of Systems  Social History   Tobacco Use  . Smoking status: Current Every Day Smoker    Packs/day: 0.25    Years: 30.00    Pack years: 7.50  . Smokeless tobacco: Never Used  . Tobacco comment: smokes 6 cigarettes daily  Substance Use Topics  . Alcohol use: Yes    Comment: occasionally      Objective:   BP 120/76   Pulse 84   Temp 98.4 F (36.9 C)   Resp 16   Ht 5\' 6"  (1.676 m)   Wt 140 lb (63.5 kg)   SpO2 97%   BMI 22.60 kg/m    Physical Exam  General appearance: alert, well developed, well nourished, cooperative and in no distress Head: Normocephalic, without obvious abnormality, atraumatic Respiratory: Respirations even and unlabored, normal respiratory rate Extremities: All extremities are intact.  Skin: Skin color, texture, turgor normal. No rashes seen  Psych: Appropriate mood and affect. Neurologic: Mental status: Alert, oriented to person, place, and time, thought content appropriate.      Assessment & Plan    1. Encounter for special screening examination for cardiovascular disorder  - Comprehensive metabolic panel - Lipid panel  2. Anxiety Well controlled - busPIRone (BUSPAR) 10 MG tablet; TAKE ONE &  ONE-HALF TABLETS BY MOUTH THREE TIMES DAILY  Dispense: 135 tablet; Refill: 11  3. Estrogen deficiency  - DG Bone Density; Future  Recommend colon cancer screening. She states her insurance sends her stool kits .   She anticipates getting flu shot at her pharmacy.   The entirety of the information documented in the History of Present Illness, Review of Systems and Physical Exam were personally obtained by me. Portions of this information were initially documented by Rachelle Presley, CMA and revieAnson Oregonwed by me for thoroughness and accuracy.      Mila Merryonald , MD  [facility]

## 2018-12-07 ENCOUNTER — Telehealth: Payer: Self-pay | Admitting: Family Medicine

## 2018-12-07 NOTE — Telephone Encounter (Signed)
FYI- Pt cancelled appointment scheduled at [facility] for bone density

## 2018-12-09 NOTE — Telephone Encounter (Signed)
FYI. Thanks.

## 2019-10-26 NOTE — Progress Notes (Signed)
I,Roshena L Chambers,acting as a scribe for Mila Merry, MD.,have documented all relevant documentation on the behalf of Mila Merry, MD,as directed by  Mila Merry, MD while in the presence of Mila Merry, MD.  Established patient visit   Patient: Lisa Henderson   DOB: 04-03-1950   69 y.o. Female  MRN: [id number] Visit Date: 10/27/2019  Today's healthcare provider: Mila Merry, MD   Chief Complaint  Patient presents with  . Anxiety   Subjective    HPI  Anxiety, Follow-up  She was last seen for anxiety 1 year ago. Changes made at last visit include no changes.   She reports good compliance with treatment. She reports good tolerance of treatment. She is not having side effects.   She feels her anxiety is moderate and Improved since last visit.  Symptoms: No chest pain No difficulty concentrating  No dizziness No fatigue  No feelings of losing control No insomnia  No irritable No palpitations  No panic attacks No racing thoughts  No shortness of breath No sweating  No tremors/shakes    GAD-7 Results GAD-7 Generalized Anxiety Disorder Screening Tool 10/27/2019 10/27/2017  1. Feeling Nervous, Anxious, or on Edge 0 1  2. Not Being Able to Stop or Control Worrying 0 0  3. Worrying Too Much About Different Things 0 1  4. Trouble Relaxing 0 0  5. Being So Restless it's Hard To Sit Still 0 0  6. Becoming Easily Annoyed or Irritable 0 1  7. Feeling Afraid As If Something Awful Might Happen 0 0  Total GAD-7 Score 0 3  Difficulty At Work, Home, or Getting  Along With Others? Not difficult at all Not difficult at all    PHQ-9 Scores PHQ9 SCORE ONLY 10/27/2019 10/27/2017 10/27/2016  PHQ-9 Total Score 0 1 1    ---------------------------------------------------------------------------------------------------     Medications: Outpatient Medications Prior to Visit  Medication Sig Note  . acetaminophen (TYLENOL) 500 MG tablet Take 500 mg by mouth at bedtime.   .  Calcium Carbonate-Vit D-Min (CALCIUM 600+D PLUS MINERALS) 600-400 MG-UNIT CHEW Chew by mouth.   . Cholecalciferol (VITAMIN D3) 2000 units CHEW Chew by mouth.   . GNP GARLIC EXTRACT PO Take 500 mg by mouth daily.   Marland Kitchen loratadine (CLARITIN) 10 MG tablet Take 10 mg by mouth daily.   . Multiple Vitamins-Minerals (MULTIVITAMIN ADULT PO) Take by mouth.   . ranitidine (ZANTAC) 150 MG tablet Take 150 mg by mouth 2 (two) times daily.   . vitamin E 180 MG (400 UNITS) capsule Take 400 Units by mouth daily.   . [DISCONTINUED] busPIRone (BUSPAR) 10 MG tablet TAKE ONE & ONE-HALF TABLETS BY MOUTH THREE TIMES DAILY 10/27/2019: CHANGE TO 15MG  TABLETS   No facility-administered medications prior to visit.    Review of Systems    Objective    BP 102/68 (BP Location: Left Arm, Patient Position: Sitting, Cuff Size: Normal)   Pulse 73   Temp 98.6 F (37 C) (Oral)   Resp 16   Ht 5\' 6"  (1.676 m)   Wt 157 lb (71.2 kg)   BMI 25.34 kg/m    Physical Exam  General appearance:  Well developed, well nourished female, cooperative and in no acute distress Head: Normocephalic, without obvious abnormality, atraumatic Respiratory: Respirations even and unlabored, normal respiratory rate Extremities: All extremities are intact.  Skin: Skin color, texture, turgor normal. No rashes seen  Psych: Appropriate mood and affect. Neurologic: Mental status: Alert, oriented to person, place, and time,  thought content appropriate.     Assessment & Plan     1. Anxiety Doing well with current buspirone regiment, will change from 10mg  tablets to 15mg  tablets for convenience.  - busPIRone (BUSPAR) 15 MG tablet; Take 1 tablet (15 mg total) by mouth 3 (three) times daily.  Dispense: 270 tablet; Refill: 4  Encouraged Covid vaccine.    Encouraged her to call to schedule mammogram. She declined all other HM items.      The entirety of the information documented in the History of Present Illness, Review of Systems and  Physical Exam were personally obtained by me. Portions of this information were initially documented by the CMA and reviewed by me for thoroughness and accuracy.      , MD  [facility] [phone] (phone) [phone] (fax)  [facility]

## 2019-10-27 ENCOUNTER — Encounter: Payer: Self-pay | Admitting: Family Medicine

## 2019-10-27 ENCOUNTER — Ambulatory Visit (INDEPENDENT_AMBULATORY_CARE_PROVIDER_SITE_OTHER): Payer: No Typology Code available for payment source | Admitting: Family Medicine

## 2019-10-27 ENCOUNTER — Other Ambulatory Visit: Payer: Self-pay

## 2019-10-27 VITALS — BP 102/68 | HR 73 | Temp 98.6°F | Resp 16 | Ht 66.0 in | Wt 157.0 lb

## 2019-10-27 DIAGNOSIS — F419 Anxiety disorder, unspecified: Secondary | ICD-10-CM

## 2019-10-27 MED ORDER — BUSPIRONE HCL 15 MG PO TABS: 15.0000 mg | ORAL_TABLET | Freq: Three times a day (TID) | ORAL | 4 refills | Status: DC

## 2019-10-27 NOTE — Patient Instructions (Addendum)
.   Covid-19 vaccines: The Covid vaccines have been given to hundreds of millions of people and found to be very effective and are as safe as any other vaccine.  The [employer] vaccine has been associated with very rare dangerous blood clots, but only in adult women under the age of 54.  The risk of dying from Covid infections is much higher than having a serious reaction to the vaccine.  I strongly recommend getting fully vaccinated against Covid-19.  I recommend that adult women under 60 get fully vaccinated, but the [place] and [employer] vaccines may be safer for those women than the [employer] vaccine.   You can call [facility] at [phone] to schedule a Pfizer Covid vaccine  . Please call the [facility] [phone]) to schedule a routine screening mammogram.

## 2020-11-11 ENCOUNTER — Other Ambulatory Visit: Payer: Self-pay

## 2020-11-11 ENCOUNTER — Ambulatory Visit: Payer: No Typology Code available for payment source | Admitting: Family Medicine

## 2020-11-11 ENCOUNTER — Encounter: Payer: Self-pay | Admitting: Family Medicine

## 2020-11-11 VITALS — BP 119/72 | HR 77 | Temp 98.3°F | Ht 66.0 in | Wt 153.0 lb

## 2020-11-11 DIAGNOSIS — F419 Anxiety disorder, unspecified: Secondary | ICD-10-CM

## 2020-11-11 DIAGNOSIS — Z136 Encounter for screening for cardiovascular disorders: Secondary | ICD-10-CM

## 2020-11-11 MED ORDER — BUSPIRONE HCL 15 MG PO TABS: 15.0000 mg | ORAL_TABLET | Freq: Three times a day (TID) | ORAL | 4 refills | Status: DC

## 2020-11-11 NOTE — Progress Notes (Signed)
Established patient visit   Patient: Lisa Henderson   DOB: 03-17-1950   70 y.o. Female  MRN: [id number] Visit Date: 11/11/2020  Today's healthcare provider: Mila Merry, MD   No chief complaint on file.  Subjective    HPI  Anxiety, Follow-up  She was last seen for anxiety 12 months ago. Changes made at last visit include no changes.   She reports excellent compliance with treatment. She reports excellent tolerance of treatment. She is not having side effects.     Symptoms: No chest pain No difficulty concentrating  No dizziness No fatigue  No feelings of losing control No insomnia  No irritable No palpitations  No panic attacks No racing thoughts  No shortness of breath No sweating  No tremors/shakes    GAD-7 Results GAD-7 Generalized Anxiety Disorder Screening Tool 11/11/2020 10/27/2019 10/27/2017  1. Feeling Nervous, Anxious, or on Edge 1 0 1  2. Not Being Able to Stop or Control Worrying 2 0 0  3. Worrying Too Much About Different Things 1 0 1  4. Trouble Relaxing 1 0 0  5. Being So Restless it's Hard To Sit Still 0 0 0  6. Becoming Easily Annoyed or Irritable 1 0 1  7. Feeling Afraid As If Something Awful Might Happen 0 0 0  Total GAD-7 Score 6 0 3  Difficulty At Work, Home, or Getting  Along With Others? Not difficult at all Not difficult at all Not difficult at all    PHQ-9 Scores PHQ9 SCORE ONLY 11/11/2020 10/27/2019 10/27/2017  PHQ-9 Total Score 0 0 1    ---------------------------------------------------------------------------------------------------     Medications: Outpatient Medications Prior to Visit  Medication Sig   acetaminophen (TYLENOL) 500 MG tablet Take 500 mg by mouth at bedtime.   busPIRone (BUSPAR) 15 MG tablet Take 1 tablet (15 mg total) by mouth 3 (three) times daily.   Calcium Carbonate-Vit D-Min (CALCIUM 600+D PLUS MINERALS) 600-400 MG-UNIT CHEW Chew by mouth.   Cholecalciferol (VITAMIN D3) 2000 units CHEW Chew by mouth.    cimetidine (TAGAMET) 200 MG tablet Take 200 mg by mouth daily.   GNP GARLIC EXTRACT PO Take 500 mg by mouth daily.   loratadine (CLARITIN) 10 MG tablet Take 10 mg by mouth daily.   Multiple Vitamins-Minerals (MULTIVITAMIN ADULT PO) Take by mouth.   vitamin E 180 MG (400 UNITS) capsule Take 400 Units by mouth daily.   ranitidine (ZANTAC) 150 MG tablet Take 150 mg by mouth 2 (two) times daily.   No facility-administered medications prior to visit.    Review of Systems  Constitutional: Negative.   Respiratory: Negative.    Cardiovascular:  Positive for palpitations (With anxiety.). Negative for chest pain and leg swelling.  Gastrointestinal: Negative.   Psychiatric/Behavioral:  Negative for decreased concentration, dysphoric mood, self-injury, sleep disturbance and suicidal ideas. The patient is nervous/anxious.       Objective    BP 119/72 (BP Location: Right Arm, Patient Position: Sitting, Cuff Size: Large)   Pulse 77   Temp 98.3 F (36.8 C) (Oral)   Ht 5\' 6"  (1.676 m)   Wt 153 lb (69.4 kg)   SpO2 100%   BMI 24.69 kg/m    Physical Exam  General appearance: Well developed, well nourished female, cooperative and in no acute distress Head: Normocephalic, without obvious abnormality, atraumatic Respiratory: Respirations even and unlabored, normal respiratory rate Extremities: All extremities are intact.  Skin: Skin color, texture, turgor normal. No rashes seen  Psych: Appropriate  mood and affect. Neurologic: Mental status: Alert, oriented to person, place, and time, thought content appropriate.     Assessment & Plan     1. Anxiety Doing well with buspirone refilled today.  - busPIRone (BUSPAR) 15 MG tablet; Take 1 tablet (15 mg total) by mouth 3 (three) times daily.  Dispense: 270 tablet; Refill: 4  2. Encounter for special screening examination for cardiovascular disorder  - CBC - Lipid panel - Comprehensive metabolic panel  She usually has mammograms done at [facility] and  advised that she needs to call and have this schedule. She declined all other routine HM recommendations today.       The entirety of the information documented in the History of Present Illness, Review of Systems and Physical Exam were personally obtained by me. Portions of this information were initially documented by the CMA and reviewed by me for thoroughness and accuracy.     Mila Merry, MD  [facility] [phone] (phone) [phone] (fax)  [facility]

## 2020-11-11 NOTE — Patient Instructions (Addendum)
Please review the attached list of medications and notify my office if there are any errors.   I recommend that you get the Prevnar-20 vaccine to protect yourself from certain strains of pneumonia. Please call our office at [phone] at your earliest convenience to schedule this vaccine.    You are due for routine mammogram. Please call [facility] mammography to schedule

## 2020-11-15 LAB — COMPREHENSIVE METABOLIC PANEL
AG Ratio: 1.7 (calc) (ref 1.0–2.5)
ALT: 16 U/L (ref 6–29)
AST: 22 U/L (ref 10–35)
Albumin: 4.3 g/dL (ref 3.6–5.1)
Alkaline phosphatase (APISO): 56 U/L (ref 37–153)
BUN: 13 mg/dL (ref 7–25)
CO2: 26 mmol/L (ref 20–32)
Calcium: 9 mg/dL (ref 8.6–10.4)
Chloride: 106 mmol/L (ref 98–110)
Creat: 0.78 mg/dL (ref 0.50–1.05)
Globulin: 2.5 g/dL (calc) (ref 1.9–3.7)
Glucose, Bld: 93 mg/dL (ref 65–99)
Potassium: 4.1 mmol/L (ref 3.5–5.3)
Sodium: 140 mmol/L (ref 135–146)
Total Bilirubin: 0.4 mg/dL (ref 0.2–1.2)
Total Protein: 6.8 g/dL (ref 6.1–8.1)

## 2020-11-15 LAB — LIPID PANEL
Cholesterol: 217 mg/dL — ABNORMAL HIGH (ref <200)
HDL: 65 mg/dL (ref >=50)
LDL Cholesterol (Calc): 123 mg/dL (calc) — ABNORMAL HIGH
Non-HDL Cholesterol (Calc): 152 mg/dL (calc) — ABNORMAL HIGH (ref <130)
Total CHOL/HDL Ratio: 3.3 (calc) (ref <5.0)
Triglycerides: 169 mg/dL — ABNORMAL HIGH (ref <150)

## 2020-11-15 LAB — CBC
HCT: 38.5 % (ref 35.0–45.0)
Hemoglobin: 13.1 g/dL (ref 11.7–15.5)
MCH: 31.8 pg (ref 27.0–33.0)
MCHC: 34 g/dL (ref 32.0–36.0)
MCV: 93.4 fL (ref 80.0–100.0)
MPV: 9.4 fL (ref 7.5–12.5)
Platelets: 346 10*3/uL (ref 140–400)
RBC: 4.12 10*6/uL (ref 3.80–5.10)
RDW: 12.6 % (ref 11.0–15.0)
WBC: 8.9 10*3/uL (ref 3.8–10.8)

## 2021-11-04 ENCOUNTER — Ambulatory Visit: Payer: No Typology Code available for payment source | Admitting: Family Medicine

## 2021-11-04 ENCOUNTER — Encounter: Payer: Self-pay | Admitting: Family Medicine

## 2021-11-04 VITALS — BP 104/68 | HR 79 | Temp 98.4°F | Resp 14 | Wt 148.0 lb

## 2021-11-04 DIAGNOSIS — E785 Hyperlipidemia, unspecified: Secondary | ICD-10-CM

## 2021-11-04 DIAGNOSIS — Z720 Tobacco use: Secondary | ICD-10-CM | POA: Diagnosis not present

## 2021-11-04 DIAGNOSIS — F419 Anxiety disorder, unspecified: Secondary | ICD-10-CM

## 2021-11-04 DIAGNOSIS — M5030 Other cervical disc degeneration, unspecified cervical region: Secondary | ICD-10-CM | POA: Diagnosis not present

## 2021-11-04 MED ORDER — BUSPIRONE HCL 15 MG PO TABS: 15.0000 mg | ORAL_TABLET | Freq: Three times a day (TID) | ORAL | 4 refills | Status: DC

## 2021-11-04 NOTE — Progress Notes (Signed)
I,Roshena L Chambers,acting as a scribe for Lelon Huh, MD.,have documented all relevant documentation on the behalf of Lelon Huh, MD,as directed by  Lelon Huh, MD while in the presence of Lelon Huh, MD.   Established patient visit   Patient: Lisa Henderson   DOB: Oct 10, 1950   71 y.o. Female  MRN: [id number] Visit Date: 11/04/2021  Today's healthcare provider: Lelon Huh, MD   Chief Complaint  Patient presents with   Anxiety   Subjective    HPI  Anxiety, Follow-up  She was last seen for anxiety 2  years  ago. Changes made at last visit include changing buspirone from 10mg  to 15mg  three times daily.   She reports good compliance with treatment. She reports good tolerance of treatment. She is not having side effects.   She feels her anxiety is Unchanged since last visit.  Symptoms: No chest pain No difficulty concentrating  No dizziness No fatigue  No feelings of losing control No insomnia  No irritable No palpitations  No panic attacks No racing thoughts  No shortness of breath No sweating  No tremors/shakes    GAD-7 Results    11/11/2020    3:10 PM 10/27/2019    3:10 PM 10/27/2017    1:43 PM  GAD-7 Generalized Anxiety Disorder Screening Tool  1. Feeling Nervous, Anxious, or on Edge 1 0 1  2. Not Being Able to Stop or Control Worrying 2 0 0  3. Worrying Too Much About Different Things 1 0 1  4. Trouble Relaxing 1 0 0  5. Being So Restless it's Hard To Sit Still 0 0 0  6. Becoming Easily Annoyed or Irritable 1 0 1  7. Feeling Afraid As If Something Awful Might Happen 0 0 0  Total GAD-7 Score 6 0 3  Difficulty At Work, Home, or Getting  Along With Others? Not difficult at all Not difficult at all Not difficult at all    PHQ-9 Scores    11/04/2021    2:37 PM 11/11/2020    3:07 PM 10/27/2019    3:09 PM  PHQ9 SCORE ONLY  PHQ-9 Total Score 0 0 0     ---------------------------------------------------------------------------------------------------  Tobacco use: Patient would like a prescription for Nicorette inhaler to help with smoking cessation.   Cervical Degenerative disc: Patient is here to have a handicap parking application completed.   Medications: Outpatient Medications Prior to Visit  Medication Sig   acetaminophen (TYLENOL) 500 MG tablet Take 500 mg by mouth at bedtime.   busPIRone (BUSPAR) 15 MG tablet Take 1 tablet (15 mg total) by mouth 3 (three) times daily.   Calcium Carbonate-Vit D-Min (CALCIUM 600+D PLUS MINERALS) 600-400 MG-UNIT CHEW Chew by mouth.   Cholecalciferol (VITAMIN D3) 2000 units CHEW Chew by mouth.   cimetidine (TAGAMET) 200 MG tablet Take 200 mg by mouth daily.   GNP GARLIC EXTRACT PO Take [id number] mg by mouth daily.   loratadine (CLARITIN) 10 MG tablet Take 10 mg by mouth daily.   Multiple Vitamins-Minerals (MULTIVITAMIN ADULT PO) Take by mouth.   Multiple Vitamins-Minerals (ZINC PO) Take by mouth.   vitamin E 180 MG (400 UNITS) capsule Take 400 Units by mouth daily.   No facility-administered medications prior to visit.    Review of Systems  Constitutional:  Negative for appetite change, chills, fatigue and fever.  Respiratory:  Negative for chest tightness and shortness of breath.   Cardiovascular:  Negative for chest pain and palpitations.  Gastrointestinal:  Negative  for abdominal pain, nausea and vomiting.  Neurological:  Negative for dizziness and weakness.       Objective    BP 104/68 (BP Location: Left Arm, Patient Position: Sitting, Cuff Size: Normal)   Pulse 79   Temp 98.4 F (36.9 C) (Oral)   Resp 14   Wt 148 lb (67.1 kg)   SpO2 98% Comment: room air  BMI 23.89 kg/m    Physical Exam  General appearance: Well developed, well nourished female, cooperative and in no acute distress Head: Normocephalic, without obvious abnormality, atraumatic Respiratory: Respirations even  and unlabored, normal respiratory rate Extremities: All extremities are intact.  Skin: Skin color, texture, turgor normal. No rashes seen  Psych: Appropriate mood and affect. Neurologic: Mental status: Alert, oriented to person, place, and time, thought content appropriate.   Assessment & Plan     1. Anxiety Doing very on current dose of buspirone. refill busPIRone (BUSPAR) 15 MG tablet; Take 1 tablet (15 mg total) by mouth 3 (three) times daily.  Dispense: 270 tablet; Refill: 4  2. Hyperlipidemia, unspecified hyperlipidemia type She states she has significantly improved her diet since last year.  - Lipid panel - Comprehensive metabolic panel  3. Degenerative disc disease, cervical Has trouble walking long distances, completely forms of disability parking permit  4. Tobacco abuse Prescription for Nicorette inhaler, although it looks this is availably online without a prescription. Encouraged smoking cessation.       The entirety of the information documented in the History of Present Illness, Review of Systems and Physical Exam were personally obtained by me. Portions of this information were initially documented by the CMA and reviewed by me for thoroughness and accuracy.     Lelon Huh, MD  [facility] [phone] (phone) [phone] (fax)  [place]

## 2021-11-18 LAB — LIPID PANEL
Cholesterol: 250 mg/dL — ABNORMAL HIGH (ref <200)
HDL: 67 mg/dL (ref >=50)
LDL Cholesterol (Calc): 149 mg/dL (calc) — ABNORMAL HIGH
Non-HDL Cholesterol (Calc): 183 mg/dL (calc) — ABNORMAL HIGH (ref <130)
Total CHOL/HDL Ratio: 3.7 (calc) (ref <5.0)
Triglycerides: 196 mg/dL — ABNORMAL HIGH (ref <150)

## 2021-11-18 LAB — COMPREHENSIVE METABOLIC PANEL
AG Ratio: 1.5 (calc) (ref 1.0–2.5)
ALT: 21 U/L (ref 6–29)
AST: 24 U/L (ref 10–35)
Albumin: 4.3 g/dL (ref 3.6–5.1)
Alkaline phosphatase (APISO): 59 U/L (ref 37–153)
BUN: 14 mg/dL (ref 7–25)
CO2: 26 mmol/L (ref 20–32)
Calcium: 9.6 mg/dL (ref 8.6–10.4)
Chloride: 106 mmol/L (ref 98–110)
Creat: 0.79 mg/dL (ref 0.50–1.05)
Globulin: 2.9 g/dL (calc) (ref 1.9–3.7)
Glucose, Bld: 93 mg/dL (ref 65–99)
Potassium: 4.8 mmol/L (ref 3.5–5.3)
Sodium: 141 mmol/L (ref 135–146)
Total Bilirubin: 0.3 mg/dL (ref 0.2–1.2)
Total Protein: 7.2 g/dL (ref 6.1–8.1)

## 2021-11-27 ENCOUNTER — Telehealth: Payer: Self-pay

## 2021-11-27 NOTE — Telephone Encounter (Signed)
pts appt DOS 9.19.23 / pt paid $30 copay but she received information from her insurance that her responsibility should have only been $10 / pt has [employer] / please advise about if this is correct of pt needs refund/ pt asked if she could hear back about this today

## 2021-12-11 NOTE — Telephone Encounter (Signed)
Pt called in to follow up on billing issue pts appt DOS 9.19.23 / pt paid $30 copay but she received information from her insurance that her responsibility should have only been $10 / pt has [employer] is requesting a call back from [place].  Please advise.

## 2022-02-23 ENCOUNTER — Telehealth: Payer: Self-pay | Admitting: Family Medicine

## 2022-02-23 DIAGNOSIS — K222 Esophageal obstruction: Secondary | ICD-10-CM

## 2022-02-23 NOTE — Telephone Encounter (Signed)
Copied from [place] [phone]. Topic: Referral - Request for Referral >> Feb 23, 2022 10:20 AM Ludger Nutting wrote: Has patient seen PCP for this complaint? No. *If NO, is insurance requiring patient see PCP for this issue before PCP can refer them? Referral for which specialty: Gertie Fey Preferred provider/office: [facility]/[phone] Reason for referral: Partial Esophageal obstruction(Per [facility] on 02/22/22)

## 2022-02-24 NOTE — Telephone Encounter (Signed)
Pt is calling back to follow up on the referral requested. Pt is requesting this be sent as soon as possible because she is hardly eating. Pt declined NT. Stated Med link that PCP can access to get referral # [phone] part of [place] consultations and referrals.  Please advise.

## 2022-02-27 ENCOUNTER — Encounter: Payer: Self-pay | Admitting: Family Medicine

## 2022-02-27 ENCOUNTER — Ambulatory Visit: Payer: No Typology Code available for payment source | Admitting: Family Medicine

## 2022-02-27 VITALS — BP 115/68 | HR 76 | Wt 140.5 lb

## 2022-02-27 DIAGNOSIS — E785 Hyperlipidemia, unspecified: Secondary | ICD-10-CM | POA: Diagnosis not present

## 2022-02-27 NOTE — Progress Notes (Signed)
I,Sha'taria Tyson,acting as a Education administrator for Lelon Huh, MD.,have documented all relevant documentation on the behalf of Lelon Huh, MD,as directed by  Lelon Huh, MD while in the presence of Lelon Huh, MD.   Established patient visit   Patient: Lisa Henderson   DOB: 1950-11-07   72 y.o. Female  MRN: [id number] Visit Date: 02/27/2022  Today's healthcare provider: Lelon Huh, MD   No chief complaint on file.  Subjective    HPI  -esophageal structure surgery next week on the 18th -Declined vaccines today Lipid/Cholesterol, Follow-up  Last lipid panel Other pertinent labs  Lab Results  Component Value Date   CHOL 250 (H) 11/17/2021   HDL 67 11/17/2021   LDLCALC 149 (H) 11/17/2021   TRIG 196 (H) 11/17/2021   CHOLHDL 3.7 11/17/2021   Lab Results  Component Value Date   ALT 21 11/17/2021   AST 24 11/17/2021   PLT 346 11/14/2020   TSH 0.84 05/27/2015     She was last seen for this 3 months ago.  Management since that visit includes start pravastatin 20mg  once a day.  She never started pravastatin, but did make significant changes to diet by cutting back on fats, especially ice cream.    Symptoms: No chest pain No chest pressure/discomfort  No dyspnea No lower extremity edema  No numbness or tingling of extremity No orthopnea  No palpitations No paroxysmal nocturnal dyspnea  No speech difficulty No syncope   The 10-year ASCVD risk score (Arnett DK, et al., 2019) is: 17.4%  ---------------------------------------------------------------------------------------------------   Medications: Outpatient Medications Prior to Visit  Medication Sig   acetaminophen (TYLENOL) 500 MG tablet Take 500 mg by mouth at bedtime.   busPIRone (BUSPAR) 15 MG tablet Take 1 tablet (15 mg total) by mouth 3 (three) times daily.   Calcium Carbonate-Vit D-Min (CALCIUM 600+D PLUS MINERALS) 600-400 MG-UNIT CHEW Chew by mouth.   Cholecalciferol (VITAMIN D3) 2000 units CHEW Chew by  mouth.   cimetidine (TAGAMET) 200 MG tablet Take 200 mg by mouth daily.   GNP GARLIC EXTRACT PO Take 992 mg by mouth daily.   loratadine (CLARITIN) 10 MG tablet Take 10 mg by mouth daily.   Multiple Vitamins-Minerals (MULTIVITAMIN ADULT PO) Take by mouth.   Multiple Vitamins-Minerals (ZINC PO) Take by mouth.   vitamin E 180 MG (400 UNITS) capsule Take 400 Units by mouth daily.   No facility-administered medications prior to visit.    Review of Systems  Respiratory: Negative.  Negative for cough, shortness of breath and wheezing.   Cardiovascular:  Negative for chest pain, palpitations and leg swelling.  Neurological:  Negative for weakness and headaches.       Objective    BP 115/68 (BP Location: Left Arm, Patient Position: Sitting, Cuff Size: Normal)   Pulse 76   Wt 140 lb 8 oz (63.7 kg)   SpO2 100%   BMI 22.68 kg/m    Physical Exam   General appearance: Well developed, well nourished female, cooperative and in no acute distress Head: Normocephalic, without obvious abnormality, atraumatic Respiratory: Respirations even and unlabored, normal respiratory rate Extremities: All extremities are intact.  Skin: Skin color, texture, turgor normal. No rashes seen  Psych: Appropriate mood and affect. Neurologic: Mental status: Alert, oriented to person, place, and time, thought content appropriate.    Assessment & Plan     1. Hyperlipidemia, unspecified hyperlipidemia type Had significantly reduced fat in her diet since lipids last checked. Diet also affected by esophageal stenosis  and is scheduled for dilation at [facility] next week.   - Lipid panel      The entirety of the information documented in the History of Present Illness, Review of Systems and Physical Exam were personally obtained by me. Portions of this information were initially documented by the CMA and reviewed by me for thoroughness and accuracy.     Lelon Huh, MD  [facility] [phone]  (phone) [phone] (fax)  [place]

## 2022-06-06 LAB — LIPID PANEL
Cholesterol: 231 mg/dL — ABNORMAL HIGH (ref <200)
HDL: 59 mg/dL (ref >=50)
LDL Cholesterol (Calc): 137 mg/dL (calc) — ABNORMAL HIGH
Non-HDL Cholesterol (Calc): 172 mg/dL (calc) — ABNORMAL HIGH (ref <130)
Total CHOL/HDL Ratio: 3.9 (calc) (ref <5.0)
Triglycerides: 201 mg/dL — ABNORMAL HIGH (ref <150)

## 2022-07-07 ENCOUNTER — Ambulatory Visit: Payer: Self-pay | Admitting: *Deleted

## 2022-07-07 ENCOUNTER — Telehealth: Payer: No Typology Code available for payment source | Admitting: Physician Assistant

## 2022-07-07 ENCOUNTER — Encounter: Payer: Self-pay | Admitting: Physician Assistant

## 2022-07-07 DIAGNOSIS — R0989 Other specified symptoms and signs involving the circulatory and respiratory systems: Secondary | ICD-10-CM

## 2022-07-07 MED ORDER — AZITHROMYCIN 250 MG PO TABS: ORAL_TABLET | ORAL | 0 refills | Status: AC

## 2022-07-07 NOTE — Progress Notes (Unsigned)
MyChart Video Visit  Virtual Visit via Video Note   This format is felt to be most appropriate for this patient at this time. Physical exam was limited by quality of the video and audio technology used for the visit.   Patient location: office {Patient Location:[id number]::"Home"}  I discussed the limitations of evaluation and management by telemedicine and the availability of in person appointments. The patient expressed understanding and agreed to proceed.  Patient: Lisa Henderson   DOB: [birth year]   72 y.o. Female  MRN: [id number] Visit Date: 07/07/2022  Today's healthcare provider: Debera Lat, PA-C   Chief Complaint  Patient presents with   Nasal Congestion    symptoms, headache ,coughing w/dark yellow mucus, congestion, sore throat, low grade fever-7 days. COVID-negative.   Subjective    HPI HPI     Nasal Congestion    Additional comments: symptoms, headache ,coughing w/dark yellow mucus, congestion, sore throat, low grade fever-7 days. COVID-negative.      Last edited by Shelly Bombard, CMA on 07/07/2022  1:19 PM.      Hx of pnemonia Retired Engineer, civil (consulting) Current day smokers Sick contact last Sunday, was sick a day after. Onset with sore throat. 100 Symptomatic: tylenol, robitussin DM max Colitis and duodenal ulcer, cannot take Ibuprofen     Medications: Outpatient Medications Prior to Visit  Medication Sig   acetaminophen (TYLENOL) 500 MG tablet Take 500 mg by mouth at bedtime.   busPIRone (BUSPAR) 15 MG tablet Take 1 tablet (15 mg total) by mouth 3 (three) times daily.   Calcium Carbonate-Vit D-Min (CALCIUM 600+D PLUS MINERALS) 600-400 MG-UNIT CHEW Chew by mouth.   Cholecalciferol (VITAMIN D3) 2000 units CHEW Chew by mouth.   cimetidine (TAGAMET) 200 MG tablet Take 200 mg by mouth daily.   GNP GARLIC EXTRACT PO Take 500 mg by mouth daily.   loratadine (CLARITIN) 10 MG tablet Take 10 mg by mouth daily.   Multiple Vitamins-Minerals (MULTIVITAMIN ADULT PO) Take by  mouth.   Multiple Vitamins-Minerals (ZINC PO) Take by mouth.   vitamin E 180 MG (400 UNITS) capsule Take 400 Units by mouth daily.   [DISCONTINUED] dicyclomine (BENTYL) 10 MG/5ML solution Take by mouth. (Patient not taking: Reported on 02/27/2022)   No facility-administered medications prior to visit.    Review of Systems  Constitutional:  Positive for fever.  HENT:  Positive for congestion and sore throat.   Respiratory:  Positive for cough and chest tightness.   Musculoskeletal:  Positive for joint swelling and myalgias.   Except see HPI   {Labs  Heme  Chem  Endocrine  Serology  Results Review (optional):23779}   Objective    There were no vitals taken for this visit.  {Show previous vital signs (optional):23777}   Physical Exam     Assessment & Plan     1. Upper respiratory symptom Acute    No follow-ups on file.     I discussed the assessment and treatment plan with the patient. The patient was provided an opportunity to ask questions and all were answered. The patient agreed with the plan and demonstrated an understanding of the instructions.   The patient was advised to call back or seek an in-person evaluation if the symptoms worsen or if the condition fails to improve as anticipated.  I provided *** minutes of non-face-to-face time during this encounter. The patient was advised to call back or seek an in-person evaluation if the symptoms worsen or if the condition fails to improve as anticipated.  I discussed the assessment and treatment plan with the patient. The patient was provided an opportunity to ask questions and all were answered. The patient agreed with the plan and demonstrated an understanding of the instructions.  I, Debera Lat, PA-C have reviewed all documentation for this visit. The documentation on  5/21/24for the exam, diagnosis, procedures, and orders are all accurate and complete.  Debera Lat, [facility], MMS [facility] [phone] (phone) [phone] (fax)  [facility]

## 2022-07-07 NOTE — Telephone Encounter (Signed)
  Chief Complaint: URI symptoms.   Exposed to sick children in church on Mother's Day.   Has history of pneumonia. Symptoms: Congested cough, nasal congestion, sore throat, body aches, fever 99 and not feeling well.  Covid test negative Frequency: Symptoms started last Wed. With a sore throat Pertinent Negatives: Patient denies being able to take many antibiotics.   She can take Z Pak without problems if an antibiotic is needed. Disposition: [] ED /[] Urgent Care (no appt availability in office) / [x] Appointment(In office/virtual)/ []  [facility] [facility]/ [] Home Care/ [] Refused Recommended Disposition /[]  Mobile Bus/ []  Follow-up with PCP Additional Notes: Virtual visit made with Debera Lat, PA-C for today at 1:20.

## 2022-07-07 NOTE — Telephone Encounter (Signed)
Message from Allen Kell sent at 07/07/2022  9:11 AM EDT  Summary: flu?   Pt states that she was exposed to the flu and is wanting to see if her PCP can call in a z pack for her. Per pt she is sensitive to all medications and antibiotics but the z pack she is able to take. Pt states she has a history of pneumonia. Please advise.          Call History   Type Contact Phone/Fax User  07/07/2022 09:09 AM EDT Phone (Incoming) Lisa, Henderson (Self) [phone] (H) Lisa Henderson, Lisa Henderson   Reason for Disposition  Fever present > 3 days (72 hours)  Answer Assessment - Initial Assessment Questions 1. WORST SYMPTOM: "What is your worst symptom?" (e.g., cough, runny nose, muscle aches, headache, sore throat, fever)      Sore throat, Coughing up dark yellow mucus, low grade fever, body aches, No shortness of breath.    Covid negative     I have a  2. ONSET: "When did your flu symptoms start?"      Last Wed. Started with sore throat.   THur. Felt bad and all the other symptoms started 3. COUGH: "How bad is the cough?"       Not asked 4. RESPIRATORY DISTRESS: "Describe your breathing."      No shortness of breath 5. FEVER: "Do you have a fever?" If Yes, ask: "What is your temperature, how was it measured, and when did it start?"     Low grade fever 99 6. EXPOSURE: "Were you exposed to someone with influenza?"       Possibly.   People behind me in church on Mother's day were coughing.    7. FLU VACCINE: "Did you get a flu shot this year?"     Not asked 8. HIGH RISK DISEASE: "Do you have any chronic medical problems?" (e.g., heart or lung disease, asthma, weak immune system, or other HIGH RISK conditions)     History of pneumonia 9. PREGNANCY: "Is there any chance you are pregnant?" "When was your last menstrual period?"     N/A 10. OTHER SYMPTOMS: "Do you have any other symptoms?"  (e.g., runny nose, muscle aches, headache, sore throat)       See above  Protocols used: Influenza (Flu) -  [facility]

## 2022-10-23 ENCOUNTER — Ambulatory Visit: Payer: No Typology Code available for payment source | Admitting: Family Medicine

## 2022-10-28 ENCOUNTER — Ambulatory Visit: Payer: No Typology Code available for payment source | Admitting: Family Medicine

## 2022-10-28 ENCOUNTER — Encounter: Payer: Self-pay | Admitting: Family Medicine

## 2022-10-28 VITALS — BP 107/61 | HR 59 | Ht 66.0 in | Wt 145.0 lb

## 2022-10-28 DIAGNOSIS — Z5320 Procedure and treatment not carried out because of patient's decision for unspecified reasons: Secondary | ICD-10-CM

## 2022-10-28 DIAGNOSIS — E785 Hyperlipidemia, unspecified: Secondary | ICD-10-CM

## 2022-10-28 DIAGNOSIS — Z720 Tobacco use: Secondary | ICD-10-CM | POA: Diagnosis not present

## 2022-10-28 DIAGNOSIS — F419 Anxiety disorder, unspecified: Secondary | ICD-10-CM | POA: Diagnosis not present

## 2022-10-28 MED ORDER — BUSPIRONE HCL 15 MG PO TABS: 15.0000 mg | ORAL_TABLET | Freq: Three times a day (TID) | ORAL | 4 refills | Status: DC

## 2022-10-28 NOTE — Progress Notes (Signed)
Established patient visit   Patient: Lisa Henderson   DOB: 1950-06-24   72 y.o. Female  MRN: [id number] Visit Date: 10/28/2022  Today's healthcare provider: Mila Merry, MD   Chief Complaint  Patient presents with   Medication Management   Medical Management of Chronic Issues    Patient would like to discuss nicotine patch  as she is considering starting.    Subjective    HPI  Presents for follow up general anxiety , continues on 15mg  buspirone 2-3 times a day which she states remains effective and well tolerated. She would like to continue current regiment. She is also working on getting cholesterol down with healthy diet.  Lab Results  Component Value Date   CHOL 231 (H) 06/05/2022   HDL 59 06/05/2022   LDLCALC 137 (H) 06/05/2022   TRIG 201 (H) 06/05/2022   CHOLHDL 3.9 06/05/2022   She is also working on smoking cessation and tried gums which helped a little bit, but is planning on trying low dose patch. She has currently cut down to just about 6 cigarettes a day.   Medications: Outpatient Medications Prior to Visit  Medication Sig   acetaminophen (TYLENOL) 500 MG tablet Take 500 mg by mouth at bedtime.   Calcium Carbonate-Vit D-Min (CALCIUM 600+D PLUS MINERALS) 600-400 MG-UNIT CHEW Chew by mouth.   Cholecalciferol (VITAMIN D3) 2000 units CHEW Chew by mouth.   cimetidine (TAGAMET) 200 MG tablet Take 200 mg by mouth daily.   GNP GARLIC EXTRACT PO Take 500 mg by mouth daily.   loratadine (CLARITIN) 10 MG tablet Take 10 mg by mouth daily.   Multiple Vitamins-Minerals (MULTIVITAMIN ADULT PO) Take by mouth.   Multiple Vitamins-Minerals (ZINC PO) Take by mouth.   vitamin E 180 MG (400 UNITS) capsule Take 400 Units by mouth daily.   [DISCONTINUED] busPIRone (BUSPAR) 15 MG tablet Take 1 tablet (15 mg total) by mouth 3 (three) times daily.   No facility-administered medications prior to visit.    Review of Systems  Respiratory: Negative.  Negative for cough, shortness  of breath and wheezing.   Cardiovascular:  Negative for chest pain, palpitations and leg swelling.  Neurological:  Negative for weakness and headaches.       Objective    BP 107/61 (BP Location: Left Arm, Patient Position: Sitting, Cuff Size: Normal)   Pulse (!) 59   Ht 5\' 6"  (1.676 m)   Wt 145 lb (65.8 kg)   SpO2 100%   BMI 23.40 kg/m    Physical Exam  General appearance: Well developed, well nourished female, cooperative and in no acute distress Head: Normocephalic, without obvious abnormality, atraumatic Respiratory: Respirations even and unlabored, normal respiratory rate Extremities: All extremities are intact.  Skin: Skin color, texture, turgor normal. No rashes seen  Psych: Appropriate mood and affect. Neurologic: Mental status: Alert, oriented to person, place, and time, thought content appropriate.   Assessment & Plan     1. Anxiety Doing well current medications. refill busPIRone (BUSPAR) 15 MG tablet; Take 1 tablet (15 mg total) by mouth 3 (three) times daily.  Dispense: 270 tablet; Refill: 4  2. Hyperlipidemia, unspecified hyperlipidemia type Working in [employer].  - Comprehensive metabolic panel - Lipid panel  3. Mammogram declined   4. Screening for hepatitis C declined   5. Colon cancer screening declined   6. Tobacco abuse Working on smoking cessation, planning on trying patch. Discussed benefits of pneumonia vaccine which she is going to think  about.          Mila Merry, MD  [facility] [phone] (phone) [phone] (fax)  [facility]

## 2022-12-19 LAB — COMPREHENSIVE METABOLIC PANEL
AG Ratio: 1.6 (calc) (ref 1.0–2.5)
ALT: 20 U/L (ref 6–29)
AST: 25 U/L (ref 10–35)
Albumin: 4.2 g/dL (ref 3.6–5.1)
Alkaline phosphatase (APISO): 62 U/L (ref 37–153)
BUN: 15 mg/dL (ref 7–25)
CO2: 28 mmol/L (ref 20–32)
Calcium: 9.3 mg/dL (ref 8.6–10.4)
Chloride: 105 mmol/L (ref 98–110)
Creat: 0.79 mg/dL (ref 0.60–1.00)
Globulin: 2.6 g/dL (ref 1.9–3.7)
Glucose, Bld: 95 mg/dL (ref 65–99)
Potassium: 4.3 mmol/L (ref 3.5–5.3)
Sodium: 140 mmol/L (ref 135–146)
Total Bilirubin: 0.4 mg/dL (ref 0.2–1.2)
Total Protein: 6.8 g/dL (ref 6.1–8.1)

## 2022-12-19 LAB — LIPID PANEL
Cholesterol: 238 mg/dL — ABNORMAL HIGH (ref <200)
HDL: 67 mg/dL (ref >=50)
LDL Cholesterol (Calc): 138 mg/dL — ABNORMAL HIGH
Non-HDL Cholesterol (Calc): 171 mg/dL — ABNORMAL HIGH (ref <130)
Total CHOL/HDL Ratio: 3.6 (calc) (ref <5.0)
Triglycerides: 189 mg/dL — ABNORMAL HIGH (ref <150)

## 2023-04-14 ENCOUNTER — Encounter: Payer: Self-pay | Admitting: Family Medicine

## 2023-04-14 ENCOUNTER — Telehealth: Payer: Self-pay | Admitting: Family Medicine

## 2023-04-14 DIAGNOSIS — R052 Subacute cough: Secondary | ICD-10-CM

## 2023-04-14 MED ORDER — AZITHROMYCIN 250 MG PO TABS: ORAL_TABLET | ORAL | 0 refills | Status: AC

## 2023-04-14 NOTE — Progress Notes (Signed)
 MyChart Video Visit    Virtual Visit via Video Note   This format is felt to be most appropriate for this patient at this time. Physical exam was limited by quality of the video and audio technology used for the visit.   Patient location: Patient's home address   Provider location: [facility]  [address], [address]  [place], [place] [address]   I discussed the limitations of evaluation and management by telemedicine and the availability of in person appointments. The patient expressed understanding and agreed to proceed.  Patient: Lisa Henderson   DOB: 08/25/1950   73 y.o. Female  MRN: [id number] Visit Date: 04/14/2023  Today's healthcare provider: Ronnald Ramp, MD   Chief Complaint  Patient presents with   Cough   Subjective    HPI   Discussed the use of AI scribe software for clinical note transcription with the patient, who gave verbal consent to proceed.  History of Present Illness   Lisa Henderson is a 73 year old female with a history of pneumonia who presents with a persistent cough.  She developed flu-like symptoms on March 03, 2023, followed by a productive cough with medium green sputum that has persisted for four to five weeks. She has a history of pneumonia and experienced similar symptoms last January, for which she was treated with a Z-Pak.  She experiences a low-grade fever with intermittent chills, decreased appetite, weakness, shakiness, and slight shortness of breath with exertion. Initially, she had a sore throat, which has since resolved. Occasional wheezing occurs, particularly upon waking, and her symptoms have remained consistent over the past few weeks.  Her current medications include Robitussin D-Max and Tylenol. She has not conducted any home COVID testing due to expired tests. 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           Past Medical History:  Diagnosis Date   Anxiety    Depression    GERD (gastroesophageal reflux disease)     Medications: Outpatient Medications Prior to Visit  Medication Sig   acetaminophen (TYLENOL) 500 MG tablet Take 500 mg by mouth at bedtime.   busPIRone (BUSPAR) 15 MG tablet Take 1 tablet (15 mg total) by mouth 3 (three) times daily.   Calcium Carbonate-Vit D-Min (CALCIUM 600+D PLUS MINERALS) 600-400 MG-UNIT CHEW Chew by mouth.   Cholecalciferol (VITAMIN D3) 2000 units CHEW Chew by mouth.   cimetidine (TAGAMET) 200 MG tablet Take 200 mg by mouth daily.   GNP GARLIC EXTRACT PO Take 500 mg by mouth daily.   loratadine (CLARITIN) 10 MG tablet Take 10 mg by mouth daily.   Multiple Vitamins-Minerals (MULTIVITAMIN ADULT PO) Take by mouth.   Multiple Vitamins-Minerals (ZINC PO) Take by mouth.   vitamin E 180 MG (400 UNITS) capsule Take 400 Units by mouth daily.   No facility-administered medications prior to visit.    Review of Systems      Objective    There were no vitals taken for this visit.      Physical Exam Constitutional:      General: She is not in acute distress.    Appearance: Normal appearance. She is ill-appearing.  Pulmonary:     Effort: Pulmonary effort is normal. No respiratory distress.     Comments: Speaking in complete sentences, does not appear to be in respiratory distress, patient is breathing with normal effort on RA   Nonproductive cough observed  throughout virtual visit today  Neurological:     Mental Status: She is alert and oriented to person, place, and time.        Assessment & Plan     Problem List Items Addressed This Visit   None Visit Diagnoses       Subacute cough    -  Primary   Relevant Medications   azithromycin (ZITHROMAX Z-PAK) 250 MG tablet         Chronic Cough with Suspected Bacterial Infection Acute Productive cough with green sputum for 4-5 weeks post-flu (January 15th). Symptoms: intermittent  low-grade fever, chills, shortness of breath, and morning wheezing. History of pneumonia. Using Robitussin D and Tylenol. No recent COVID testing. Suspected bacterial infection due to symptom duration and nature. Previously tolerated azithromycin (Z-Pak). Discussed antibiotic risks (side effects, resistance) and benefits (symptom relief, infection control). Alternatives: symptomatic treatment without antibiotics, but azithromycin preferred due to prolonged symptoms. - Prescribe azithromycin (Z-Pak): 500 mg on the first day, followed by 250 mg daily for the next four days - [facility] on [address].         No follow-ups on file.     I discussed the assessment and treatment plan with the patient. The patient was provided an opportunity to ask questions and all were answered. The patient agreed with the plan and demonstrated an understanding of the instructions.   The patient was advised to call back or seek an in-person evaluation if the symptoms worsen or if the condition fails to improve as anticipated.  I provided 10 minutes of non-face-to-face time during this encounter.   Ronnald Ramp, MD [facility] [phone] (phone) [phone] (fax)  [facility]

## 2023-06-02 ENCOUNTER — Other Ambulatory Visit: Payer: Self-pay | Admitting: Family Medicine

## 2023-06-02 DIAGNOSIS — F419 Anxiety disorder, unspecified: Secondary | ICD-10-CM

## 2023-06-02 NOTE — Telephone Encounter (Signed)
 Copied from CRM [id number]. Topic: Clinical - Medication Refill >> Jun 02, 2023 12:40 PM Lizabeth Riggs wrote: Most Recent Primary Care Visit:  Provider: Mimi Alt  Department: [facility]  Visit Type: ACUTE  Date: 04/14/2023  Medication: busPIRone (BUSPAR) 15 MG tablet - 270 tablets for a 90 day supply  Has the patient contacted their pharmacy? Yes (Agent: If no, request that the patient contact the pharmacy for the refill. If patient does not wish to contact the pharmacy document the reason why and proceed with request.) (Agent: If yes, when and what did the pharmacy advise?) Pharmacy needs an order to refill for a 90 day supply  Is this the correct pharmacy for this prescription? Yes If no, delete pharmacy and type the correct one.  This is the patient's preferred pharmacy:  [facility] [address] [place] - [address] [place] [address] Phone: [phone] Fax: [phone]  Has the prescription been filled recently? No  Is the patient out of the medication? No  Has the patient been seen for an appointment in the last year OR does the patient have an upcoming appointment? Yes  Can we respond through MyChart? Yes  Agent: Please be advised that Rx refills may take up to 3 business days. We ask that you follow-up with your pharmacy.

## 2023-06-03 NOTE — Telephone Encounter (Signed)
 Requested Prescriptions  Refused Prescriptions Disp Refills   busPIRone (BUSPAR) 15 MG tablet 270 tablet 4    Sig: Take 1 tablet (15 mg total) by mouth 3 (three) times daily.     Psychiatry: Anxiolytics/Hypnotics - Non-controlled Failed - 06/03/2023 12:19 PM      Failed - Valid encounter within last 12 months    Recent Outpatient Visits           1 month ago Subacute cough   [facility] Vaugh[place], Judyann Number, MD

## 2023-11-19 ENCOUNTER — Other Ambulatory Visit: Payer: Self-pay | Admitting: Family Medicine

## 2023-11-19 DIAGNOSIS — F419 Anxiety disorder, unspecified: Secondary | ICD-10-CM

## 2023-11-29 ENCOUNTER — Ambulatory Visit (INDEPENDENT_AMBULATORY_CARE_PROVIDER_SITE_OTHER): Admitting: Family Medicine

## 2023-11-29 ENCOUNTER — Encounter: Payer: Self-pay | Admitting: Family Medicine

## 2023-11-29 VITALS — BP 111/64 | HR 75 | Resp 16 | Ht 66.0 in | Wt 134.9 lb

## 2023-11-29 DIAGNOSIS — Z1211 Encounter for screening for malignant neoplasm of colon: Secondary | ICD-10-CM | POA: Diagnosis not present

## 2023-11-29 DIAGNOSIS — E785 Hyperlipidemia, unspecified: Secondary | ICD-10-CM

## 2023-11-29 DIAGNOSIS — F419 Anxiety disorder, unspecified: Secondary | ICD-10-CM

## 2023-11-29 DIAGNOSIS — Z0000 Encounter for general adult medical examination without abnormal findings: Secondary | ICD-10-CM

## 2023-11-29 DIAGNOSIS — Z0001 Encounter for general adult medical examination with abnormal findings: Secondary | ICD-10-CM

## 2023-11-29 NOTE — Telephone Encounter (Unsigned)
 Copied from CRM [id number]. Topic: Clinical - Medication Refill >> Nov 29, 2023  3:39 PM Lonell PEDLAR wrote: Medication: busPIRone  (BUSPAR ) 15 MG tablet  Has the patient contacted their pharmacy? No, had OV today, forgott o ask provider to fill  This is the patient's preferred pharmacy:  [facility] [address], [place] - [address] [place] [address] Phone: [phone] Fax: [phone]  Is this the correct pharmacy for this prescription? Yes If no, delete pharmacy and type the correct one.   Has the prescription been filled recently? Yes  Is the patient out of the medication? No  Has the patient been seen for an appointment in the last year OR does the patient have an upcoming appointment? Yes  Can we respond through MyChart? Yes  Agent: Please be advised that Rx refills may take up to 3 business days. We ask that you follow-up with your pharmacy.

## 2023-11-29 NOTE — Patient Instructions (Addendum)
 Please review the attached list of medications and notify my office if there are any errors.   I recommend that you get the Prevnar 20 vaccine to protect yourself from certain dangerous strains of pneumonia. You can get Prevnar 20 at your pharmacy, or call our office at [phone] at your earliest convenience to schedule this vaccine.   I recommend that you get a flu vaccine this year. Please call our office at [phone] at your earliest convenience to schedule a flu shot.

## 2023-11-29 NOTE — Addendum Note (Signed)
 Addended by: ULLA AQUAS A on: 11/29/2023 03:59 PM   Modules accepted: Orders

## 2023-11-29 NOTE — Telephone Encounter (Signed)
 Duplicate request, LRF 11/22/23 fro 90 days.  Requested Prescriptions  Pending Prescriptions Disp Refills   busPIRone  (BUSPAR ) 15 MG tablet 90 tablet 0    Sig: Take 1 tablet (15 mg total) by mouth 3 (three) times daily.     Psychiatry: Anxiolytics/Hypnotics - Non-controlled Passed - 11/29/2023  4:13 PM      Passed - Valid encounter within last 12 months    Recent Outpatient Visits           Today Hyperlipidemia, unspecified hyperlipidemia type   [facility] Gasper Nancyann BRAVO, MD   7 months ago Subacute cough   [facility] [place] [employer], [place], [place]              Signed Prescriptions Disp Refills   busPIRone  (BUSPAR ) 15 MG tablet 90 tablet 0    Sig: TAKE 1 TABLET BY MOUTH THREE TIMES DAILY     Psychiatry: Anxiolytics/Hypnotics - Non-controlled Passed - 11/29/2023  4:13 PM      Passed - Valid encounter within last 12 months    Recent Outpatient Visits           Today Hyperlipidemia, unspecified hyperlipidemia type   [facility] Gasper Nancyann BRAVO, MD   7 months ago Subacute cough   [facility] [place], [place], MD

## 2023-12-13 NOTE — Progress Notes (Signed)
 Complete physical exam   Patient: Lisa Henderson   DOB: 1950/07/17   73 y.o. Female  MRN: [id number] Visit Date: 11/29/2023  Today's healthcare provider: Nancyann Perry, MD   Chief Complaint  Patient presents with   Annual Exam   Subjective    Lisa Henderson is a 73 y.o. female who presents today for a complete physical exam.  She has no physical complaints today, but is under a lot of stress related to death of one of her children. She reports she is up to date on mammograms at [facility] through Dr. SALVADOR.   Past Medical History:  Diagnosis Date   Anxiety    Depression    GERD (gastroesophageal reflux disease)    History reviewed. No pertinent surgical history. Social History   Socioeconomic History   Marital status: Married    Spouse name: Not on file   Number of children: Not on file   Years of education: Not on file   Highest education level: Associate degree: academic program  Occupational History   Occupation: Retired  Tobacco Use   Smoking status: Every Day    Current packs/day: 0.25    Average packs/day: 0.3 packs/day for 30.0 years (7.5 ttl pk-yrs)    Types: Cigarettes   Smokeless tobacco: Never   Tobacco comments:    smokes 6 cigarettes daily  Substance and Sexual Activity   Alcohol use: Yes    Comment: occasionally   Drug use: No   Sexual activity: Not on file  Other Topics Concern   Not on file  Social History Narrative   Not on file   Social Drivers of Health   Financial Resource Strain: Low Risk  (10/27/2022)   Overall Financial Resource Strain (CARDIA)    Difficulty of Paying Living Expenses: Not very hard  Food Insecurity: No Food Insecurity (10/27/2022)   Hunger Vital Sign    Worried About Running Out of Food in the Last Year: Never true    Ran Out of Food in the Last Year: Never true  Transportation Needs: No Transportation Needs (10/27/2022)   PRAPARE - Administrator, Civil Service (Medical): No    Lack of  Transportation (Non-Medical): No  Physical Activity: Insufficiently Active (10/27/2022)   Exercise Vital Sign    Days of Exercise per Week: 3 days    Minutes of Exercise per Session: 30 min  Stress: No Stress Concern Present (10/27/2022)   [employer] of Occupational Health - Occupational Stress Questionnaire    Feeling of Stress : Only a little  Social Connections: Socially Integrated (10/27/2022)   Social Connection and Isolation Panel    Frequency of Communication with Friends and Family: More than three times a week    Frequency of Social Gatherings with Friends and Family: Once a week    Attends Religious Services: More than 4 times per year    Active Member of [employer] or Organizations: Yes    Attends Banker Meetings: 1 to 4 times per year    Marital Status: Married  Catering Manager Violence: Not on file   Family Status  Relation Name Status   Mother  Alive   Father  Deceased at age 50       suicide   Sister  Alive   Sister  Alive   Sister  Alive  No partnership data on file   Family History  Problem Relation Age of Onset   Asthma Mother  Heart disease Mother    Hypertension Mother    Arthritis Mother    Osteoporosis Mother    Diabetes Father    Kidney disease Father    Arthritis Father    Asthma Sister    Allergies Sister    Asthma Sister    Allergies Sister    Asthma Sister    Allergies Sister    Allergies  Allergen Reactions   Bicillin C-R     REACTION: unspecified   Cefaclor Hives    REACTION: unspecified   Diphenhydramine Hcl     REACTION: unspecified   Fluoxetine Hcl     REACTION: unspecified   Penicillin G Hives   Precedex [Dexmedetomidine Hcl In Nacl]    Prednisone Other (See Comments)    REACTION: unspecified   Propofol    Robinul [Glycopyrrolate]    Tetanus Toxoid     REACTION: severe reaction 1980's    Patient Care Team: Gasper Nancyann BRAVO, MD as PCP - General (Family Medicine)   Medications: Outpatient Medications  Prior to Visit  Medication Sig   acetaminophen (TYLENOL) 500 MG tablet Take 500 mg by mouth at bedtime.   busPIRone  (BUSPAR ) 15 MG tablet TAKE 1 TABLET BY MOUTH THREE TIMES DAILY   Calcium Carbonate-Vit D-Min (CALCIUM 600+D PLUS MINERALS) 600-400 MG-UNIT CHEW Chew by mouth.   Cholecalciferol (VITAMIN D3) 2000 units CHEW Chew by mouth.   cimetidine (TAGAMET) 200 MG tablet Take 200 mg by mouth daily.   GNP GARLIC EXTRACT PO Take 500 mg by mouth daily.   loratadine (CLARITIN) 10 MG tablet Take 10 mg by mouth daily.   Multiple Vitamins-Minerals (MULTIVITAMIN ADULT PO) Take by mouth.   Multiple Vitamins-Minerals (ZINC PO) Take by mouth.   vitamin E 180 MG (400 UNITS) capsule Take 400 Units by mouth daily.   No facility-administered medications prior to visit.    Review of Systems  Constitutional:  Negative for appetite change, chills, fatigue and fever.  Respiratory:  Negative for chest tightness and shortness of breath.   Cardiovascular:  Negative for chest pain and palpitations.  Gastrointestinal:  Negative for abdominal pain, nausea and vomiting.  Neurological:  Negative for dizziness and weakness.      Objective    BP 111/64 (BP Location: Left Arm, Patient Position: Sitting, Cuff Size: Normal)   Pulse 75   Resp 16   Ht 5' 6 (1.676 m)   Wt 134 lb 14.4 oz (61.2 kg)   SpO2 99%   BMI 21.77 kg/m    Physical Exam   General Appearance:    Well developed, well nourished female. Alert, cooperative, in no acute distress, appears stated age   Head:    Normocephalic, without obvious abnormality, atraumatic  Eyes:    PERRL, conjunctiva/corneas clear, EOM's intact, fundi    benign, both eyes  Ears:    Normal TM's and external ear canals, both ears  Nose:   Nares normal, septum midline, mucosa normal, no drainage    or sinus tenderness  Throat:   Lips, mucosa, and tongue normal; teeth and gums normal  Neck:   Supple, symmetrical, trachea midline, no adenopathy;    thyroid:  no  enlargement/tenderness/nodules; no carotid   bruit or JVD  Back:     Symmetric, no curvature, ROM normal, no CVA tenderness  Lungs:     Clear to auscultation bilaterally, respirations unlabored  Chest Wall:    No tenderness or deformity   Heart:    Normal heart rate. Normal rhythm. No murmurs,  rubs, or gallops.   Breast Exam:    deferred  Abdomen:     Soft, non-tender, bowel sounds active all four quadrants,    no masses, no organomegaly  Pelvic:    deferred  Extremities:   All extremities are intact. No cyanosis or edema  Pulses:   2+ and symmetric all extremities  Skin:   Skin color, texture, turgor normal, no rashes or lesions  Lymph nodes:   Cervical, supraclavicular, and axillary nodes normal  Neurologic:   CNII-XII intact, normal strength, sensation and reflexes    throughout     Last depression screening scores    11/29/2023    2:56 PM 07/07/2022    1:21 PM 02/27/2022    1:45 PM  PHQ 2/9 Scores  PHQ - 2 Score 0 0 0  PHQ- 9 Score  0    Last fall risk screening    11/29/2023    2:56 PM  Fall Risk   Falls in the past year? 0  Number falls in past yr: 0  Injury with Fall? 0  Risk for fall due to : No Fall Risks   Last Audit-C alcohol use screening    10/27/2022    2:05 PM  Alcohol Use Disorder Test (AUDIT)  1. How often do you have a drink containing alcohol? 1  2. How many drinks containing alcohol do you have on a typical day when you are drinking? 0  3. How often do you have six or more drinks on one occasion? 0  AUDIT-C Score 1      Patient-reported   A score of 3 or more in women, and 4 or more in men indicates increased risk for alcohol abuse, EXCEPT if all of the points are from question 1   No results found for any visits on 11/29/23.  Assessment & Plan    Routine Health Maintenance and Physical Exam  Exercise Activities and Dietary recommendations  Goals   None     Immunization History  Administered Date(s) Administered   Influenza,inj,Quad  PF,6+ Mos 11/22/2018    Health Maintenance  Topic Date Due   Hepatitis C Screening  Never done   DTaP/Tdap/Td (1 - Tdap) Never done   Pneumococcal Vaccine: 50+ Years (1 of 2 - PCV) Never done   Colonoscopy  Never done   Zoster Vaccines- Shingrix (1 of 2) Never done   Mammogram  11/23/2018   DEXA SCAN  06/02/2020   COVID-19 Vaccine (1 - 2025-26 season) Never done   Influenza Vaccine  05/16/2024 (Originally 09/17/2023)   Meningococcal B Vaccine  Aged Out    Discussed health benefits of physical activity, and encouraged her to engage in regular exercise appropriate for her age and condition.   2. Hyperlipidemia, unspecified hyperlipidemia type Managing with diet.  - CBC - Comprehensive metabolic panel with GFR - Lipid panel  3. Anxiety - Doing well with current dose of buspirone .   4. Colon cancer screening  - Cologuard        Nancyann Perry, MD  [facility] [phone] (phone) [phone] (fax)  [facility]

## 2023-12-18 ENCOUNTER — Other Ambulatory Visit: Payer: Self-pay | Admitting: Family Medicine

## 2023-12-18 DIAGNOSIS — F419 Anxiety disorder, unspecified: Secondary | ICD-10-CM

## 2024-01-14 ENCOUNTER — Telehealth: Payer: Self-pay

## 2024-02-04 ENCOUNTER — Ambulatory Visit: Payer: Self-pay | Admitting: Family Medicine

## 2024-02-04 LAB — COMPREHENSIVE METABOLIC PANEL WITH GFR
ALT: 23 IU/L (ref 0–32)
AST: 28 IU/L (ref 0–40)
Albumin: 4.5 g/dL (ref 3.8–4.8)
Alkaline Phosphatase: 73 IU/L (ref 49–135)
BUN/Creatinine Ratio: 23 (ref 12–28)
BUN: 19 mg/dL (ref 8–27)
Bilirubin Total: 0.3 mg/dL (ref 0.0–1.2)
CO2: 24 mmol/L (ref 20–29)
Calcium: 10.1 mg/dL (ref 8.7–10.3)
Chloride: 105 mmol/L (ref 96–106)
Creatinine, Ser: 0.82 mg/dL (ref 0.57–1.00)
Globulin, Total: 2.4 g/dL (ref 1.5–4.5)
Glucose: 102 mg/dL — ABNORMAL HIGH (ref 70–99)
Potassium: 4.8 mmol/L (ref 3.5–5.2)
Sodium: 143 mmol/L (ref 134–144)
Total Protein: 6.9 g/dL (ref 6.0–8.5)
eGFR: 76 mL/min/1.73

## 2024-02-04 LAB — LIPID PANEL
Chol/HDL Ratio: 3.2 ratio (ref 0.0–4.4)
Cholesterol, Total: 232 mg/dL — ABNORMAL HIGH (ref 100–199)
HDL: 72 mg/dL
LDL Chol Calc (NIH): 141 mg/dL — ABNORMAL HIGH (ref 0–99)
Triglycerides: 111 mg/dL (ref 0–149)
VLDL Cholesterol Cal: 19 mg/dL (ref 5–40)

## 2024-02-04 LAB — CBC
Hematocrit: 42.5 % (ref 34.0–46.6)
Hemoglobin: 13.7 g/dL (ref 11.1–15.9)
MCH: 31.2 pg (ref 26.6–33.0)
MCHC: 32.2 g/dL (ref 31.5–35.7)
MCV: 97 fL (ref 79–97)
Platelets: 383 x10E3/uL (ref 150–450)
RBC: 4.39 x10E6/uL (ref 3.77–5.28)
RDW: 12.4 % (ref 11.7–15.4)
WBC: 9 x10E3/uL (ref 3.4–10.8)

## 2024-03-03 ENCOUNTER — Encounter: Payer: Self-pay | Admitting: Internal Medicine

## 2024-03-03 ENCOUNTER — Telehealth (INDEPENDENT_AMBULATORY_CARE_PROVIDER_SITE_OTHER): Admitting: Internal Medicine

## 2024-03-03 ENCOUNTER — Other Ambulatory Visit: Payer: Self-pay

## 2024-03-03 DIAGNOSIS — R051 Acute cough: Secondary | ICD-10-CM

## 2024-03-03 DIAGNOSIS — J40 Bronchitis, not specified as acute or chronic: Secondary | ICD-10-CM

## 2024-03-03 MED ORDER — BENZONATATE 100 MG PO CAPS: 100.0000 mg | ORAL_CAPSULE | Freq: Two times a day (BID) | ORAL | 0 refills | Status: AC | PRN

## 2024-03-03 MED ORDER — AZITHROMYCIN 250 MG PO TABS: ORAL_TABLET | ORAL | 0 refills | Status: AC

## 2024-03-03 NOTE — Progress Notes (Signed)
 Virtual Visit via Video Note  I connected with Lisa Henderson on 03/03/24 at 10:00 AM EST by a video enabled telemedicine application and verified that I am speaking with the correct person using two identifiers.  Location: Patient: Home Provider: [facility]   I discussed the limitations of evaluation and management by telemedicine and the availability of in person appointments. The patient expressed understanding and agreed to proceed.  History of Present Illness:  Discussed the use of AI scribe software for clinical note transcription with the patient, who gave verbal consent to proceed.  History of Present Illness Lisa Henderson is a 74 year old female with a history of pneumonia who presents with flu-like symptoms.  For the past 2 to 2.5 weeks she has had body aches, headache, head congestion, coughing, and sneezing. About a week ago she began coughing up dark green mucus. She reports intermittent low-grade fevers of 99 to 100F. She denies shortness of breath, wheezing, sinus pain, or ear pain. She has a productive cough and marked head congestion. She gets pneumonia about once a year.  She has been taking Xycam cold and flu, intermittent Robitussin DMX, Tylenol, and nasal saline spray for congestion. She takes vitamin C and zinc. Her past medical history includes colitis and an ulcer. She is sensitive to most antibiotics but tolerates azithromycin  and is allergic to penicillins.    Observations/Objective:  General: well appearing, no acute distress ENT: conjunctiva normal appearing bilaterally, hoarse voice  Skin: no rashes, cyanosis or abnormal bruising noted Neuro: answers all questions appropriately   Assessment and Plan: Assessment & Plan Bronchitis Symptoms suggest bronchitis with possible bacterial infection. Azithromycin  selected due to penicillin sensitivity and history of colitis and ulcer. - Prescribed azithromycin  (Z-Pak). - Prescribed cough suppressant for bedtime. -  Recommended Mucinex in the morning. - Advised Claritin D or Allegra D for congestion. - Recommended nasal saline or Flonase. - Advised rest, hydration, vitamin C, and zinc. - Instructed to seek evaluation if symptoms worsen or persist.  - azithromycin  (ZITHROMAX ) 250 MG tablet; Take 2 tablets on day 1, then 1 tablet daily on days 2 through 5  Dispense: 6 tablet; Refill: 0 - benzonatate  (TESSALON ) 100 MG capsule; Take 1 capsule (100 mg total) by mouth 2 (two) times daily as needed for cough.  Dispense: 20 capsule; Refill: 0  Follow Up Instructions: PRN    I discussed the assessment and treatment plan with the patient. The patient was provided an opportunity to ask questions and all were answered. The patient agreed with the plan and demonstrated an understanding of the instructions.   The patient was advised to call back or seek an in-person evaluation if the symptoms worsen or if the condition fails to improve as anticipated.  I provided 12 minutes of non-face-to-face time during this encounter.   Sharyle Fischer, DO

## 2024-03-20 ENCOUNTER — Other Ambulatory Visit: Payer: Self-pay | Admitting: Family Medicine

## 2024-03-20 DIAGNOSIS — F419 Anxiety disorder, unspecified: Secondary | ICD-10-CM
